# Patient Record
Sex: Male | Born: 1965 | Hispanic: No | Smoking: Current every day smoker
Health system: Southern US, Community
[De-identification: ages and names within clinical notes are randomized; demographics above are authoritative.]

## PROBLEM LIST (undated history)

## (undated) DIAGNOSIS — S72001A Fracture of unspecified part of neck of right femur, initial encounter for closed fracture: Secondary | ICD-10-CM

## (undated) DIAGNOSIS — K469 Unspecified abdominal hernia without obstruction or gangrene: Secondary | ICD-10-CM

## (undated) DIAGNOSIS — E119 Type 2 diabetes mellitus without complications: Secondary | ICD-10-CM

## (undated) DIAGNOSIS — E785 Hyperlipidemia, unspecified: Secondary | ICD-10-CM

## (undated) DIAGNOSIS — A809 Acute poliomyelitis, unspecified: Secondary | ICD-10-CM

## (undated) HISTORY — DX: Unspecified abdominal hernia without obstruction or gangrene: K46.9

## (undated) HISTORY — DX: Hyperlipidemia, unspecified: E78.5

## (undated) HISTORY — PX: HERNIA REPAIR: SHX51

## (undated) HISTORY — DX: Fracture of unspecified part of neck of right femur, initial encounter for closed fracture: S72.001A

## (undated) HISTORY — DX: Type 2 diabetes mellitus without complications: E11.9

---

## 2006-09-14 ENCOUNTER — Emergency Department (HOSPITAL_COMMUNITY): Admission: EM | Admit: 2006-09-14 | Discharge: 2006-09-14 | Payer: Self-pay | Admitting: Emergency Medicine

## 2006-10-10 ENCOUNTER — Emergency Department (HOSPITAL_COMMUNITY): Admission: EM | Admit: 2006-10-10 | Discharge: 2006-10-10 | Payer: Self-pay | Admitting: Family Medicine

## 2007-04-24 ENCOUNTER — Ambulatory Visit: Payer: Self-pay | Admitting: Nurse Practitioner

## 2007-05-30 ENCOUNTER — Ambulatory Visit (HOSPITAL_COMMUNITY): Admission: RE | Admit: 2007-05-30 | Discharge: 2007-05-30 | Payer: Self-pay | Admitting: Internal Medicine

## 2007-07-18 ENCOUNTER — Encounter: Admission: RE | Admit: 2007-07-18 | Discharge: 2007-08-10 | Payer: Self-pay | Admitting: Internal Medicine

## 2007-07-23 ENCOUNTER — Ambulatory Visit (HOSPITAL_BASED_OUTPATIENT_CLINIC_OR_DEPARTMENT_OTHER): Admission: RE | Admit: 2007-07-23 | Discharge: 2007-07-23 | Payer: Self-pay | Admitting: General Surgery

## 2007-09-04 DIAGNOSIS — K469 Unspecified abdominal hernia without obstruction or gangrene: Secondary | ICD-10-CM

## 2007-09-04 HISTORY — DX: Unspecified abdominal hernia without obstruction or gangrene: K46.9

## 2008-02-03 ENCOUNTER — Emergency Department (HOSPITAL_COMMUNITY): Admission: EM | Admit: 2008-02-03 | Discharge: 2008-02-03 | Payer: Self-pay | Admitting: Emergency Medicine

## 2009-11-13 ENCOUNTER — Encounter: Admission: RE | Admit: 2009-11-13 | Discharge: 2009-11-13 | Payer: Self-pay | Admitting: Specialist

## 2011-04-12 NOTE — Op Note (Signed)
NAMEHENDRICK, PAVICH                 ACCOUNT NO.:  [id number]   MEDICAL RECORD NO.:  [id number]          PATIENT TYPE:  AMB   LOCATION:  DSC                          FACILITY:  [facility]   PHYSICIAN:  Gabrielle Dare. Janee Morn, M.D.DATE OF BIRTH:  16-Jul-1966   DATE OF PROCEDURE:  07/23/2007  DATE OF DISCHARGE:                               OPERATIVE REPORT   PREOPERATIVE DIAGNOSIS:  Right inguinal hernia.   POSTOPERATIVE DIAGNOSIS:  Right inguinal hernia.   PROCEDURE:  Repair of right inguinal hernia with mesh.   SURGEON:  Gabrielle Dare. Janee Morn, M.D.   ANESTHESIA:  General with laryngeal mask airway.   HISTORY OF PRESENT ILLNESS:  Mr. Biskup is a 45 year old gentleman who I  evaluated in the office for a symptomatic right inguinal hernia.  He  presents today for elective repair.   PROCEDURE IN DETAIL:  Informed consent was obtained.  The patient  received intravenous antibiotics.  He was identified, and his site was  marked.  He was brought to the operating room.  General anesthesia with  laryngeal mask airway was administered by the anesthesia staff.  His  abdomen and groins were prepped and draped in sterile fashion.  0.25%  Marcaine with epinephrine was injected along the planned line of  incision in the right groin and out towards the anterior-superior iliac  spine for some nerve block and postoperative pain relief.  The right  inguinal incision was made.  The subcutaneous tissues were dissected  down through Scarpa's fascia, revealing the external oblique fascia.  This was somewhat attenuated out laterally.  It was divided at the very  lateral aspect, and the superior aspect of the external oblique was  dissected free off the underlying musculature and the inferior aspect  was dissected down, revealing the shelving edge of the inguinal  ligament.  The cord structures were encircled with a Penrose drain.  The  floor was then inspected, and he had a direct inguinal hernia present.  This  easily reduced.  The cord was inspected at the internal ring.  There was no indirect hernia sac present.  The cord structures were  protected.  The direct hernia was then held reduced with a single figure-  of-eight 2-0 Vicryl suture from the shelving edge of the inguinal  ligament to the transversalis.  This kept the direct hernia sac reduced.  The hernia repair was then completed with a polypropylene keyhole mesh.  This was fashioned to shape and then secured to the tissues over the  pubic tubercle medially with the 0 Prolene suture.  It was sewn in a  running fashion with 0 Prolene inferiorly along the shelving edge of the  inguinal ligament out laterally, and then it was tacked down in an  interrupted fashion with 0 Prolene again medially by the tissues over  the pubic tubercle and then extending out laterally in the superior  portion along the transversalis.  The two leaflets of the mesh were  rejoined behind the cord to recreate the ring.  They were tacked  together and to the underlying musculature with several interrupted 0  Prolene sutures.  The area was copiously irrigated.  Meticulous  hemostasis was ensured.  The aperture in the mesh admitted the tip of a  fifth digit along with the cord structures.  The cord structures  remained nonedematous and viable.  Some additional local anesthetic was  injected.  The external oblique fascia was then closed with a running 2-  0 Vicryl suture.  The subcutaneous tissues were irrigated.  Hemostasis  was again ensured.  Scarpa's fascia was closed with interrupted 3-0  Vicryl sutures, and the skin was closed with running 4-0 Monocryl  subcuticular stitch.  Sponge, needle and instrument counts were correct.  Benzoin, Steri-Strips, and sterile dressings were applied.   The patient tolerated the procedure well without apparent complication.  His right testicle was returned to anatomic position in the scrotum at  the end of the case, and he was  taken to the recovery room in stable  condition.      Gabrielle Dare Janee Morn, M.D.  Electronically Signed     BET/MEDQ  D:  07/23/2007  T:  07/24/2007  Job:  [id number]   cc:   Fleet Contras, M.D.

## 2011-09-09 LAB — POCT HEMOGLOBIN-HEMACUE
Hemoglobin: 18.3 — ABNORMAL HIGH
Operator id: 123881

## 2013-01-29 ENCOUNTER — Ambulatory Visit (INDEPENDENT_AMBULATORY_CARE_PROVIDER_SITE_OTHER): Payer: BC Managed Care – PPO | Admitting: Emergency Medicine

## 2013-01-29 VITALS — BP 133/76 | HR 71 | Temp 98.0°F | Resp 18 | Ht 66.0 in | Wt 137.0 lb

## 2013-01-29 DIAGNOSIS — E119 Type 2 diabetes mellitus without complications: Secondary | ICD-10-CM

## 2013-01-29 DIAGNOSIS — K649 Unspecified hemorrhoids: Secondary | ICD-10-CM

## 2013-01-29 MED ORDER — SIMVASTATIN 20 MG PO TABS: 20.0000 mg | ORAL_TABLET | Freq: Every evening | ORAL | Status: DC

## 2013-01-29 MED ORDER — METFORMIN HCL ER (MOD) 500 MG PO TB24: ORAL_TABLET | ORAL | Status: DC

## 2013-01-29 MED ORDER — LISINOPRIL 10 MG PO TABS: 10.0000 mg | ORAL_TABLET | Freq: Every day | ORAL | Status: DC

## 2013-01-29 MED ORDER — HYDROCORTISONE ACETATE 25 MG RE SUPP: 25.0000 mg | Freq: Two times a day (BID) | RECTAL | Status: DC

## 2013-01-29 NOTE — Progress Notes (Addendum)
[facility] [address] [place] [address] [phone]  Date:  01/29/2013   Name:  Alfie Rideaux   DOB:  May 01, 1966   MRN:  [id number]  PCP:  No primary provider on file.    Chief Complaint: Rectal Pain   History of Present Illness:  [place]field Dawood is a 47 y.o. very pleasant male patient who presents with the following:  History of NIDDM.  Ran out of medication a month ago and has not been taking anything.  Has not checked his sugar ever.  Needs a new doctor and wants to come here for routine care.  Denies other complaint or health concern today.   Patient Active Problem List  Diagnosis  . HERNIA    Past Medical History  Diagnosis Date  . Diabetes mellitus without complication     Past Surgical History  Procedure Laterality Date  . Hernia repair      History  Substance Use Topics  . Smoking status: Current Every Day Smoker  . Smokeless tobacco: Never Used  . Alcohol Use: Yes     Comment: 1 beer every day    No family history on file.  Not on File  Medication list has been reviewed and updated.  No current outpatient prescriptions on file prior to visit.   No current facility-administered medications on file prior to visit.    Review of Systems:  As per HPI, otherwise negative.    Physical Examination: Filed Vitals:   01/29/13 1855  BP: 133/76  Pulse: 71  Temp: 98 F (36.7 C)  Resp: 18   Filed Vitals:   01/29/13 1855  Height: 5\' 6"  (1.676 m)  Weight: 137 lb (62.143 kg)   Body mass index is 22.12 kg/(m^2). Ideal Body Weight: Weight in (lb) to have BMI = 25: 154.6  GEN: WDWN, NAD, Non-toxic, A & O x 3 HEENT: Atraumatic, Normocephalic. Neck supple. No masses, No LAD. Ears and Nose: No external deformity. CV: RRR, No M/G/R. No JVD. No thrill. No extra heart sounds. PULM: CTA B, no wheezes, crackles, rhonchi. No retractions. No resp. distress. No accessory muscle use. ABD: S, NT, ND, +BS. No rebound. No HSM. EXTR: No  c/c/e NEURO Normal gait.  PSYCH: Normally interactive. Conversant. Not depressed or anxious appearing.  Calm demeanor.    Assessment and Plan: NIDDM Add lisinopril Labs Hemorrhoids by history Follow up in 104  Carmelina Dane, MD  Results for orders placed in visit on 01/29/13  GLUCOSE, POCT (MANUAL RESULT ENTRY)      Result Value Range   POC Glucose 158 (*) 70 - 99 mg/dl  POCT GLYCOSYLATED HEMOGLOBIN (HGB A1C)      Result Value Range   Hemoglobin A1C 7.2

## 2014-05-21 ENCOUNTER — Ambulatory Visit (INDEPENDENT_AMBULATORY_CARE_PROVIDER_SITE_OTHER): Payer: BC Managed Care – PPO | Admitting: Family Medicine

## 2014-05-21 VITALS — BP 124/72 | HR 88 | Temp 97.7°F | Resp 16 | Ht 67.0 in | Wt 131.6 lb

## 2014-05-21 DIAGNOSIS — M436 Torticollis: Secondary | ICD-10-CM

## 2014-05-21 MED ORDER — CYCLOBENZAPRINE HCL 10 MG PO TABS: 10.0000 mg | ORAL_TABLET | Freq: Every day | ORAL | Status: DC

## 2014-05-21 MED ORDER — PREDNISONE 20 MG PO TABS: 40.0000 mg | ORAL_TABLET | Freq: Every day | ORAL | Status: DC

## 2014-05-21 NOTE — Progress Notes (Signed)
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

## 2014-05-21 NOTE — Patient Instructions (Signed)

## 2015-04-27 DIAGNOSIS — B91 Sequelae of poliomyelitis: Secondary | ICD-10-CM | POA: Insufficient documentation

## 2015-04-27 DIAGNOSIS — M6281 Muscle weakness (generalized): Secondary | ICD-10-CM

## 2015-11-30 ENCOUNTER — Encounter: Payer: Self-pay | Admitting: Urgent Care

## 2015-11-30 ENCOUNTER — Ambulatory Visit (INDEPENDENT_AMBULATORY_CARE_PROVIDER_SITE_OTHER): Payer: BLUE CROSS/BLUE SHIELD | Admitting: Urgent Care

## 2015-11-30 VITALS — BP 120/80 | HR 73 | Temp 98.0°F | Resp 16 | Ht 67.0 in | Wt 130.2 lb

## 2015-11-30 DIAGNOSIS — E119 Type 2 diabetes mellitus without complications: Secondary | ICD-10-CM | POA: Diagnosis not present

## 2015-11-30 DIAGNOSIS — R109 Unspecified abdominal pain: Secondary | ICD-10-CM | POA: Diagnosis not present

## 2015-11-30 DIAGNOSIS — F17200 Nicotine dependence, unspecified, uncomplicated: Secondary | ICD-10-CM | POA: Diagnosis not present

## 2015-11-30 DIAGNOSIS — E785 Hyperlipidemia, unspecified: Secondary | ICD-10-CM | POA: Diagnosis not present

## 2015-11-30 DIAGNOSIS — R10A0 Flank pain, unspecified side: Secondary | ICD-10-CM

## 2015-11-30 DIAGNOSIS — K648 Other hemorrhoids: Secondary | ICD-10-CM | POA: Diagnosis not present

## 2015-11-30 DIAGNOSIS — K644 Residual hemorrhoidal skin tags: Secondary | ICD-10-CM

## 2015-11-30 LAB — LIPID PANEL
CHOL/HDL RATIO: 2.8 ratio (ref <=5.0)
Cholesterol: 146 mg/dL (ref 125–200)
HDL: 52 mg/dL (ref >=40)
LDL Cholesterol: 52 mg/dL (ref <130)
Triglycerides: 211 mg/dL — ABNORMAL HIGH (ref <150)
VLDL: 42 mg/dL — AB (ref <30)

## 2015-11-30 LAB — COMPLETE METABOLIC PANEL WITH GFR
ALBUMIN: 4.5 g/dL (ref 3.6–5.1)
ALK PHOS: 99 U/L (ref 40–115)
ALT: 47 U/L — AB (ref 9–46)
AST: 24 U/L (ref 10–40)
BUN: 11 mg/dL (ref 7–25)
CO2: 28 mmol/L (ref 20–31)
Calcium: 9.9 mg/dL (ref 8.6–10.3)
Chloride: 102 mmol/L (ref 98–110)
Creat: 0.66 mg/dL (ref 0.60–1.35)
GFR, Est African American: >89 mL/min (ref >=60)
GFR, Est Non African American: >89 mL/min (ref >=60)
GLUCOSE: 142 mg/dL — AB (ref 65–99)
POTASSIUM: 4.5 mmol/L (ref 3.5–5.3)
SODIUM: 139 mmol/L (ref 135–146)
Total Bilirubin: 0.6 mg/dL (ref 0.2–1.2)
Total Protein: 6.9 g/dL (ref 6.1–8.1)

## 2015-11-30 LAB — POCT GLYCOSYLATED HEMOGLOBIN (HGB A1C): Hemoglobin A1C: 6.7

## 2015-11-30 MED ORDER — ATORVASTATIN CALCIUM 40 MG PO TABS: 40.0000 mg | ORAL_TABLET | Freq: Every day | ORAL | Status: DC

## 2015-11-30 MED ORDER — METFORMIN HCL ER (MOD) 500 MG PO TB24: 1000.0000 mg | ORAL_TABLET | Freq: Two times a day (BID) | ORAL | Status: DC

## 2015-11-30 NOTE — Progress Notes (Signed)
  Medical screening examination/treatment/procedure(s) were performed by non-physician practitioner and as supervising physician I was immediately available for consultation/collaboration.     

## 2015-11-30 NOTE — Patient Instructions (Signed)
Diabetes Mellitus and Food It is important for you to manage your blood sugar (glucose) level. Your blood glucose level can be greatly affected by what you eat. Eating healthier foods in the appropriate amounts throughout the day at about the same time each day will help you control your blood glucose level. It can also help slow or prevent worsening of your diabetes mellitus. Healthy eating may even help you improve the level of your blood pressure and reach or maintain a healthy weight.  General recommendations for healthful eating and cooking habits include:  Eating meals and snacks regularly. Avoid going long periods of time without eating to lose weight.  Eating a diet that consists mainly of plant-based foods, such as fruits, vegetables, nuts, legumes, and whole grains.  Using low-heat cooking methods, such as baking, instead of high-heat cooking methods, such as deep frying. Work with your dietitian to make sure you understand how to use the Nutrition Facts information on food labels. HOW CAN FOOD AFFECT ME? Carbohydrates Carbohydrates affect your blood glucose level more than any other type of food. Your dietitian will help you determine how many carbohydrates to eat at each meal and teach you how to count carbohydrates. Counting carbohydrates is important to keep your blood glucose at a healthy level, especially if you are using insulin or taking certain medicines for diabetes mellitus. Alcohol Alcohol can cause sudden decreases in blood glucose (hypoglycemia), especially if you use insulin or take certain medicines for diabetes mellitus. Hypoglycemia can be a life-threatening condition. Symptoms of hypoglycemia (sleepiness, dizziness, and disorientation) are similar to symptoms of having too much alcohol.  If your health care provider has given you approval to drink alcohol, do so in moderation and use the following guidelines:  Women should not have more than one drink per day, and men  should not have more than two drinks per day. One drink is equal to:  12 oz of beer.  5 oz of wine.  1 oz of hard liquor.  Do not drink on an empty stomach.  Keep yourself hydrated. Have water, diet soda, or unsweetened iced tea.  Regular soda, juice, and other mixers might contain a lot of carbohydrates and should be counted. WHAT FOODS ARE NOT RECOMMENDED? As you make food choices, it is important to remember that all foods are not the same. Some foods have fewer nutrients per serving than other foods, even though they might have the same number of calories or carbohydrates. It is difficult to get your body what it needs when you eat foods with fewer nutrients. Examples of foods that you should avoid that are high in calories and carbohydrates but low in nutrients include:  Trans fats (most processed foods list trans fats on the Nutrition Facts label).  Regular soda.  Juice.  Candy.  Sweets, such as cake, pie, doughnuts, and cookies.  Fried foods. WHAT FOODS CAN I EAT? Eat nutrient-rich foods, which will nourish your body and keep you healthy. The food you should eat also will depend on several factors, including:  The calories you need.  The medicines you take.  Your weight.  Your blood glucose level.  Your blood pressure level.  Your cholesterol level. You should eat a variety of foods, including:  Protein.  Lean cuts of meat.  Proteins low in saturated fats, such as fish, egg whites, and beans. Avoid processed meats.  Fruits and vegetables.  Fruits and vegetables that may help control blood glucose levels, such as apples, mangoes, and   yams.  Dairy products.  Choose fat-free or low-fat dairy products, such as milk, yogurt, and cheese.  Grains, bread, pasta, and rice.  Choose whole grain products, such as multigrain bread, whole oats, and brown rice. These foods may help control blood pressure.  Fats.  Foods containing healthful fats, such as nuts,  avocado, olive oil, canola oil, and fish. DOES EVERYONE WITH DIABETES MELLITUS HAVE THE SAME MEAL PLAN? Because every person with diabetes mellitus is different, there is not one meal plan that works for everyone. It is very important that you meet with a dietitian who will help you create a meal plan that is just right for you.   This information is not intended to replace advice given to you by your health care provider. Make sure you discuss any questions you have with your health care provider.   Document Released: 08/11/2005 Document Revised: 12/05/2014 Document Reviewed: 10/11/2013 Elsevier Interactive Patient Education 2016 [employer]    Smoking Hazards Smoking cigarettes is extremely bad for your health. Tobacco smoke has over 200 known poisons in it. It contains the poisonous gases nitrogen oxide and carbon monoxide. There are over 60 chemicals in tobacco smoke that cause cancer. Some of the chemicals found in cigarette smoke include:   Cyanide.   Benzene.   Formaldehyde.   Methanol (wood alcohol).   Acetylene (fuel used in welding torches).   Ammonia.  Even smoking lightly shortens your life expectancy by several years. You can greatly reduce the risk of medical problems for you and your family by stopping now. Smoking is the most preventable cause of death and disease in our society. Within days of quitting smoking, your circulation improves, you decrease the risk of having a heart attack, and your lung capacity improves. There may be some increased phlegm in the first few days after quitting, and it may take months for your lungs to clear up completely. Quitting for 10 years reduces your risk of developing lung cancer to almost that of a nonsmoker.  WHAT ARE THE RISKS OF SMOKING? Cigarette smokers have an increased risk of many serious medical problems, including:  Lung cancer.   Lung disease (such as pneumonia, bronchitis, and emphysema).   Heart attack and  chest pain due to the heart not getting enough oxygen (angina).   Heart disease and peripheral blood vessel disease.   Hypertension.   Stroke.   Oral cancer (cancer of the lip, mouth, or voice box).   Bladder cancer.   Pancreatic cancer.   Cervical cancer.   Pregnancy complications, including premature birth.   Stillbirths and smaller newborn babies, birth defects, and genetic damage to sperm.   Early menopause.   Lower estrogen level for women.   Infertility.   Facial wrinkles.   Blindness.   Increased risk of broken bones (fractures).   Senile dementia.   Stomach ulcers and internal bleeding.   Delayed wound healing and increased risk of complications during surgery. Because of secondhand smoke exposure, children of smokers have an increased risk of the following:   Sudden infant death syndrome (SIDS).   Respiratory infections.   Lung cancer.   Heart disease.   Ear infections.  WHY IS SMOKING ADDICTIVE? Nicotine is the chemical agent in tobacco that is capable of causing addiction or dependence. When you smoke and inhale, nicotine is absorbed rapidly into the bloodstream through your lungs. Both inhaled and noninhaled nicotine may be addictive.  WHAT ARE THE BENEFITS OF QUITTING?  There are many health benefits  to quitting smoking. Some are:   The likelihood of developing cancer and heart disease decreases. Health improvements are seen almost immediately.   Blood pressure, pulse rate, and breathing patterns start returning to normal soon after quitting.   People who quit may see an improvement in their overall quality of life.  HOW DO YOU QUIT SMOKING? Smoking is an addiction with both physical and psychological effects, and longtime habits can be hard to change. Your health care provider can recommend:  Programs and community resources, which may include group support, education, or therapy.  Replacement products, such as  patches, gum, and nasal sprays. Use these products only as directed. Do not replace cigarette smoking with electronic cigarettes (commonly called e-cigarettes). The safety of e-cigarettes is unknown, and some may contain harmful chemicals. FOR MORE INFORMATION  American Lung Association: www.lung.org  American Cancer Society: www.cancer.org   This information is not intended to replace advice given to you by your health care provider. Make sure you discuss any questions you have with your health care provider.   Document Released: 12/22/2004 Document Revised: 09/04/2013 Document Reviewed: 05/06/2013 Elsevier Interactive Patient Education [employer]

## 2015-11-30 NOTE — Progress Notes (Signed)
    MRN: [id number] DOB: 10/20/1966  Subjective:   Kenneth Roth is a 50 y.o. male presenting for chief complaint of Diabetes  Diabetes - managed with Metformin 1,000mg  BID. Does not check blood sugar at home. Reports polydipsia, polyuria. Has not seen an ophthalmologist in ~2 years. Drinks liquor every day. Denies chest pain, shortness of breath, nausea, vomiting, abdominal pain, hematuria, cuts our wounds in his feet that won't heal. Limits carbs, does not exercise. Works as a Conservation officer, naturecashier. Smokes 1.5 ppd. Not interested in quitting. States that he understands the risks of this.  HL - managed atorvastatin. Admits good compliance with this medication. Diet and exercise as above.  Hemorrhoids - reports longstanding history of hemorrhoids. Takes Anusol for this. Would like referral to general surgery for consult.  Kenneth Roth has a current medication list which includes the following prescription(s): atorvastatin, calcium-vitamin d, and metformin. Also has No Known Allergies.  Kenneth Roth  has a past medical history of Diabetes mellitus without complication (HCC). Also  has past surgical history that includes Hernia repair.  Objective:   Vitals: BP 120/80 mmHg  Pulse 73  Temp(Src) 98 F (36.7 C) (Oral)  Resp 16  Ht 5\' 7"  (1.702 m)  Wt 130 lb 3.2 oz (59.058 kg)  BMI 20.39 kg/m2  SpO2 97%  Physical Exam  Constitutional: He is oriented to person, place, and time. He appears well-developed and well-nourished.  HENT:  Dentition with significant plaque and dental carries.  Eyes: Right eye exhibits no discharge. Left eye exhibits no discharge. No scleral icterus.  Neck: Normal range of motion. Neck supple. No thyromegaly present.  Cardiovascular: Normal rate, regular rhythm and intact distal pulses.  Exam reveals no gallop and no friction rub.   No murmur heard. Pulmonary/Chest: No respiratory distress. He has no wheezes. He has no rales.  Abdominal: Soft. Bowel sounds are normal. He exhibits no distension  and no mass. There is no tenderness.  Neurological: He is alert and oriented to person, place, and time.  Skin: Skin is warm and dry. No rash noted. No erythema. No pallor.   Results for orders placed or performed in visit on 11/30/15 (from the past 24 hour(s))  POCT glycosylated hemoglobin (Hb A1C)     Status: None   Collection Time: 11/30/15  1:43 PM  Result Value Ref Range   Hemoglobin A1C 6.7    Assessment and Plan :   1. Controlled type 2 diabetes mellitus without complication, without long-term current use of insulin (HCC) - Controlled, continue current regimen. Patient declined additional medications. Recommended dietary modifications. Referral for diabetic eye exam. - Recheck in 6 months.   2. Hyperlipidemia - Refilled atorvastatin, recheck in 6 months - Lipid panel  3. External hemorrhoids - Ambulatory referral to General Surgery - Continue using Annusol  4. Tobacco use disorder - Counseled on risks of smoking. Recommended smoking cessation, the patient declined.  5. Flank pain - Patient reported flank pain at the end of his visit, this has been ongoing and worsening for the past 2 years. He declined further work-up and stated he would return for labs and imaging as needed.  Wallis BambergMario Julius Matus, PA-C [facility] [phone] 11/30/2015 1:23 PM

## 2015-12-02 ENCOUNTER — Telehealth: Payer: Self-pay

## 2015-12-02 MED ORDER — METFORMIN HCL ER 500 MG PO TB24: 1000.0000 mg | ORAL_TABLET | Freq: Two times a day (BID) | ORAL | Status: DC

## 2015-12-02 NOTE — Telephone Encounter (Signed)
Fax from pharm that metformin ER 500 needed a PA. Called pharm who stated that it is because it was ordered as the MOD (modified) tablet. According to Mani's OV notes, he intended pt to stay on same medication which according to pharm was the regular generic metformin ER 500, 2 tablets BID. I authorized change to this and will change in EPIC.

## 2016-03-08 DIAGNOSIS — Z0271 Encounter for disability determination: Secondary | ICD-10-CM

## 2016-06-27 ENCOUNTER — Ambulatory Visit (INDEPENDENT_AMBULATORY_CARE_PROVIDER_SITE_OTHER): Payer: BLUE CROSS/BLUE SHIELD

## 2016-06-27 ENCOUNTER — Ambulatory Visit (INDEPENDENT_AMBULATORY_CARE_PROVIDER_SITE_OTHER): Payer: BLUE CROSS/BLUE SHIELD | Admitting: Family Medicine

## 2016-06-27 VITALS — BP 120/72 | HR 82 | Temp 98.1°F | Resp 16 | Ht 67.0 in | Wt 122.4 lb

## 2016-06-27 DIAGNOSIS — G8929 Other chronic pain: Secondary | ICD-10-CM

## 2016-06-27 DIAGNOSIS — R1012 Left upper quadrant pain: Secondary | ICD-10-CM | POA: Diagnosis not present

## 2016-06-27 DIAGNOSIS — E785 Hyperlipidemia, unspecified: Secondary | ICD-10-CM

## 2016-06-27 DIAGNOSIS — R109 Unspecified abdominal pain: Secondary | ICD-10-CM

## 2016-06-27 DIAGNOSIS — R634 Abnormal weight loss: Secondary | ICD-10-CM | POA: Diagnosis not present

## 2016-06-27 DIAGNOSIS — K219 Gastro-esophageal reflux disease without esophagitis: Secondary | ICD-10-CM

## 2016-06-27 DIAGNOSIS — E119 Type 2 diabetes mellitus without complications: Secondary | ICD-10-CM | POA: Diagnosis not present

## 2016-06-27 LAB — POCT URINALYSIS DIP (MANUAL ENTRY)
BILIRUBIN UA: NEGATIVE
Bilirubin, UA: NEGATIVE
GLUCOSE UA: NEGATIVE
Leukocytes, UA: NEGATIVE
Nitrite, UA: NEGATIVE
Protein Ur, POC: NEGATIVE
RBC UA: NEGATIVE
UROBILINOGEN UA: 0.2
pH, UA: 5.5

## 2016-06-27 LAB — LIPID PANEL
CHOL/HDL RATIO: 1.9 ratio (ref <=5.0)
CHOLESTEROL: 124 mg/dL — AB (ref 125–200)
HDL: 64 mg/dL (ref >=40)
LDL Cholesterol: 34 mg/dL (ref <130)
TRIGLYCERIDES: 132 mg/dL (ref <150)
VLDL: 26 mg/dL (ref <30)

## 2016-06-27 LAB — COMPREHENSIVE METABOLIC PANEL
ALBUMIN: 4.5 g/dL (ref 3.6–5.1)
ALT: 42 U/L (ref 9–46)
AST: 29 U/L (ref 10–40)
Alkaline Phosphatase: 85 U/L (ref 40–115)
BILIRUBIN TOTAL: 0.7 mg/dL (ref 0.2–1.2)
BUN: 9 mg/dL (ref 7–25)
CHLORIDE: 104 mmol/L (ref 98–110)
CO2: 26 mmol/L (ref 20–31)
CREATININE: 0.68 mg/dL (ref 0.60–1.35)
Calcium: 9.6 mg/dL (ref 8.6–10.3)
Glucose, Bld: 128 mg/dL — ABNORMAL HIGH (ref 65–99)
Potassium: 4.5 mmol/L (ref 3.5–5.3)
SODIUM: 138 mmol/L (ref 135–146)
TOTAL PROTEIN: 6.6 g/dL (ref 6.1–8.1)

## 2016-06-27 LAB — POCT CBC
Granulocyte percent: 67.2 %G (ref 37–80)
HCT, POC: 45.2 % (ref 43.5–53.7)
HEMOGLOBIN: 16.2 g/dL (ref 14.1–18.1)
LYMPH, POC: 2 (ref 0.6–3.4)
MCH, POC: 32.7 pg — AB (ref 27–31.2)
MCHC: 35.9 g/dL — AB (ref 31.8–35.4)
MCV: 91 fL (ref 80–97)
MID (CBC): 0.5 (ref 0–0.9)
MPV: 7.8 fL (ref 0–99.8)
PLATELET COUNT, POC: 180 10*3/uL (ref 142–424)
POC Granulocyte: 5.2 (ref 2–6.9)
POC LYMPH PERCENT: 26.5 %L (ref 10–50)
POC MID %: 6.3 % (ref 0–12)
RBC: 4.96 M/uL (ref 4.69–6.13)
RDW, POC: 13.1 %
WBC: 7.7 10*3/uL (ref 4.6–10.2)

## 2016-06-27 LAB — POC MICROSCOPIC URINALYSIS (UMFC): MUCUS RE: ABSENT

## 2016-06-27 LAB — POCT GLYCOSYLATED HEMOGLOBIN (HGB A1C): HEMOGLOBIN A1C: 6.4

## 2016-06-27 LAB — GLUCOSE, POCT (MANUAL RESULT ENTRY): POC Glucose: 133 mg/dl — AB (ref 70–99)

## 2016-06-27 NOTE — Progress Notes (Signed)
Patient ID: Kenneth Roth, male    DOB: 10-Dec-1965  Age: 50 y.o. MRN: [id number]  Chief Complaint  Patient presents with  . Other    "left side pain " x 8 months  . Other    weight loss    Subjective:   50 year old man who comes in with history of left flank pain that is been going on for over 8 months. He has lost about 8 pounds. The most he ever weighed was 142, but he was 1:30 at the turn of the year and now is down to 122. He eats okay his appetite has not changed much. He is separated and lives alone. He does drink 2 shots every day except for Sundays he does not drink. He smokes. He is not employed. 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 He is diabetic but does not check his sugars. He does take his medicines regularly. He has not had any chest pain or major cough. No shortness of breath. No nausea or vomiting. Bowels active well. He does have some hemorrhoid problems. No genitourinary complaints.  As he was leaving the patient told me that he has applied for disability. 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 He got turned down from disability and is going to try again.  Current allergies, medications, problem list, past/family and social histories reviewed.  Objective:  BP 120/72 (BP Location: Left Arm, Patient Position: Sitting, Cuff Size: Normal)   Pulse 82   Temp 98.1 F (36.7 C) (Oral)   Resp 16   Ht 5\' 7"  (1.702 m)   Wt 122 lb 6.4 oz (55.5 kg)   SpO2 98%   BMI 19.17 kg/m   No major distress. Walks with her severe limp from his old polio an atrophic right leg. Chest clear. Heart regular without murmurs. Abdomen soft. Bowel sounds normal. Has a very pulsatile aortic bifurcation below the umbilicus, but he is pretty slender. No CVA tenderness.  Assessment & Plan:    Assessment: 1. Loss of weight   2. Type 2 diabetes mellitus without complication, without long-term current use of insulin (HCC)   3. Gastroesophageal reflux disease without esophagitis   4. Chronic left flank pain   5. Hyperlipidemia       Plan: See orders. May need a CT scan. Need to check out his diabetes as well as checking the weight loss and flank pain.  Orders Placed This Encounter  Procedures  . DG Abd Acute W/Chest    Standing Status:   Future    Number of Occurrences:   1    Standing Expiration Date:   06/27/2017    Order Specific Question:   Reason for exam:    Answer:   abdominal pain,  flank pain, weight loss, smoker    Order Specific Question:   Preferred imaging location?    Answer:   External  . CT Abdomen Pelvis Wo Contrast    Standing Status:   Future    Standing Expiration Date:   09/27/2017    Order Specific Question:   Reason for Exam (SYMPTOM  OR DIAGNOSIS REQUIRED)    Answer:   weight loss, left flank pain; is diabetic on metformin    Order Specific Question:   Preferred imaging location?  Answer:   External    Order Specific Question:   Call Results- Best Contact Number?    Answer:   Patient request schedule it for next Monday 07/04/16  . Comprehensive metabolic panel  . Lipid panel  . POCT CBC  . POCT glucose (manual entry)  . POCT glycosylated hemoglobin (Hb A1C)  . POCT urinalysis dipstick  . POCT Microscopic Urinalysis (UMFC)    No orders of the defined types were placed in this encounter.   Results for orders placed or performed in visit on 06/27/16  POCT CBC  Result Value Ref Range   WBC 7.7 4.6 - 10.2 K/uL   Lymph, poc 2.0 0.6 - 3.4   POC LYMPH PERCENT 26.5 10 - 50 %L   MID (cbc) 0.5 0 - 0.9   POC MID % 6.3 0 - 12 %M   POC Granulocyte 5.2 2 - 6.9   Granulocyte percent 67.2 37 - 80 %G   RBC 4.96 4.69 - 6.13 M/uL   Hemoglobin 16.2 14.1 - 18.1 g/dL   HCT, POC 06.2 69.4 - 53.7 %   MCV 91.0 80 - 97 fL   MCH, POC 32.7 (A) 27 - 31.2  pg   MCHC 35.9 (A) 31.8 - 35.4 g/dL   RDW, POC 85.4 %   Platelet Count, POC 180 142 - 424 K/uL   MPV 7.8 0 - 99.8 fL  POCT glucose (manual entry)  Result Value Ref Range   POC Glucose 133 (A) 70 - 99 mg/dl  POCT glycosylated hemoglobin (Hb A1C)  Result Value Ref Range   Hemoglobin A1C 6.4   POCT urinalysis dipstick  Result Value Ref Range   Color, UA yellow yellow   Clarity, UA clear clear   Glucose, UA negative negative   Bilirubin, UA negative negative   Ketones, POC UA negative negative   Spec Grav, UA <=1.005    Blood, UA negative negative   pH, UA 5.5    Protein Ur, POC negative negative   Urobilinogen, UA 0.2    Nitrite, UA Negative Negative   Leukocytes, UA Negative Negative  POCT Microscopic Urinalysis (UMFC)  Result Value Ref Range   WBC,UR,HPF,POC None None WBC/hpf   RBC,UR,HPF,POC None None RBC/hpf   Bacteria None None, Too numerous to count   Mucus Absent Absent   Epithelial Cells, UR Per Microscopy None None, Too numerous to count cells/hpf    DG ABDOMEN ACUTE W/ 1V CHEST COMPARISON:  One-view chest 11/13/2009 FINDINGS: The lungs are clear.  The heart size is normal. The bowel gas pattern is normal. No obstruction or free air is present. No radiopaque stone is present. A transitional L1 vertebral body is present. A transitional S1 vertebral body is present. There is incomplete fusion of posterior elements at S1. Focal rightward curvature is present at L5-S1. SI degenerative changes are noted on the left. IMPRESSION: 1. No acute cardiopulmonary disease. 2. Negative abdominal radiographs. 3. Focal rightward curvature at L5-S1 associated with a transitional S1 segment and pelvic tilt.    Patient Instructions   I will let you know the remainder of the lab results in a few days when they come back  We will schedule you for a CT scan for Monday, August 7. If you do not hear from the referral's desk here at this office by Thursday please call back and try  to confirm that it did get scheduled for you and we're you are to go.  If we do not find anything that  we need to do anything differently with at this time, I recommend that you return in about 4-6 weeks for a follow-up visit regarding the weight loss.  In the meanwhile try to be eating as much as you can to see if you can stabilize her weight or gain a little bit.  The diabetes is in good control at this time.  I would like to discourage excessive alcohol intake.  Also would like to recommend that you set a target for quitting smoking.    IF you received an x-ray today, you will receive an invoice from [facility]. Please contact [facility] at [phone] with questions or concerns regarding your invoice.   IF you received labwork today, you will receive an invoice from [employer]. Please contact [facility] at [phone] with questions or concerns regarding your invoice.   Our billing staff will not be able to assist you with questions regarding bills from these companies.  You will be contacted with the lab results as soon as they are available. The fastest way to get your results is to activate your My Chart account. Instructions are located on the last page of this paperwork. If you have not heard from [place] regarding the results in 2 weeks, please contact this office.        Return in about 5 weeks (around 08/01/2016).   Shasha Buchbinder, MD 06/27/2016

## 2016-06-27 NOTE — Patient Instructions (Addendum)
I will let you know the remainder of the lab results in a few days when they come back  We will schedule you for a CT scan for Monday, August 7. If you do not hear from the referral's desk here at this office by Thursday please call back and try to confirm that it did get scheduled for you and we're you are to go.  If we do not find anything that we need to do anything differently with at this time, I recommend that you return in about 4-6 weeks for a follow-up visit regarding the weight loss.  In the meanwhile try to be eating as much as you can to see if you can stabilize her weight or gain a little bit.  The diabetes is in good control at this time.  I would like to discourage excessive alcohol intake.  Also would like to recommend that you set a target for quitting smoking.    IF you received an x-ray today, you will receive an invoice from [facility]. Please contact [facility] at [phone] with questions or concerns regarding your invoice.   IF you received labwork today, you will receive an invoice from [employer]. Please contact [facility] at [phone] with questions or concerns regarding your invoice.   Our billing staff will not be able to assist you with questions regarding bills from these companies.  You will be contacted with the lab results as soon as they are available. The fastest way to get your results is to activate your My Chart account. Instructions are located on the last page of this paperwork. If you have not heard from [place] regarding the results in 2 weeks, please contact this office.

## 2016-06-28 ENCOUNTER — Telehealth: Payer: Self-pay

## 2016-06-28 NOTE — Telephone Encounter (Signed)
Dr Alwyn Ren ordered a CT Abdomen Pelvis W/O Contrast for the patient.  I attempted to complete the pre-authorization, but I need information on what the provider is looking to rule out.  I do not see anything in the notes regarding this.  Please advise, so I can complete the insurance authorization.  Thank you.

## 2016-06-29 ENCOUNTER — Encounter: Payer: Self-pay | Admitting: *Deleted

## 2016-06-29 NOTE — Telephone Encounter (Signed)
Dr Alwyn Ren can you please address

## 2016-06-30 NOTE — Telephone Encounter (Signed)
Response for scheduling the CT:  Looking for left flank malignancy re: weight loss, pain

## 2016-07-05 ENCOUNTER — Telehealth: Payer: Self-pay

## 2016-07-05 DIAGNOSIS — R634 Abnormal weight loss: Secondary | ICD-10-CM

## 2016-07-05 DIAGNOSIS — G8929 Other chronic pain: Secondary | ICD-10-CM

## 2016-07-05 DIAGNOSIS — R109 Unspecified abdominal pain: Secondary | ICD-10-CM

## 2016-07-05 NOTE — Telephone Encounter (Signed)
The order for CT Abdomen Pelvis needs to be changed per radiologist protocol before it can be scheduled.  It needs to be changed to CT Abdomen Pelvis With Contrast.  It is currently Without Contrast.  Thank you.  CB# for [facility]: [phone]

## 2016-07-05 NOTE — Telephone Encounter (Signed)
Done

## 2016-07-25 ENCOUNTER — Inpatient Hospital Stay: Admission: RE | Admit: 2016-07-25 | Payer: Self-pay | Source: Ambulatory Visit

## 2016-08-18 DIAGNOSIS — Z0271 Encounter for disability determination: Secondary | ICD-10-CM

## 2016-08-22 ENCOUNTER — Ambulatory Visit (INDEPENDENT_AMBULATORY_CARE_PROVIDER_SITE_OTHER): Payer: BLUE CROSS/BLUE SHIELD | Admitting: Family Medicine

## 2016-08-22 VITALS — BP 122/72 | HR 88 | Temp 98.3°F | Resp 17 | Ht 67.0 in | Wt 119.0 lb

## 2016-08-22 DIAGNOSIS — E119 Type 2 diabetes mellitus without complications: Secondary | ICD-10-CM

## 2016-08-22 DIAGNOSIS — E785 Hyperlipidemia, unspecified: Secondary | ICD-10-CM | POA: Insufficient documentation

## 2016-08-22 DIAGNOSIS — J309 Allergic rhinitis, unspecified: Secondary | ICD-10-CM | POA: Diagnosis not present

## 2016-08-22 DIAGNOSIS — Z8612 Personal history of poliomyelitis: Secondary | ICD-10-CM | POA: Insufficient documentation

## 2016-08-22 DIAGNOSIS — H9202 Otalgia, left ear: Secondary | ICD-10-CM | POA: Diagnosis not present

## 2016-08-22 MED ORDER — ATORVASTATIN CALCIUM 40 MG PO TABS: 20.0000 mg | ORAL_TABLET | Freq: Every day | ORAL | 3 refills | Status: DC

## 2016-08-22 MED ORDER — METFORMIN HCL ER 500 MG PO TB24: ORAL_TABLET | ORAL | 3 refills | Status: DC

## 2016-08-22 NOTE — Progress Notes (Signed)
Patient ID: Kenneth Roth, male    DOB: [birth year]  Age: 50 y.o. MRN: [id number]  Chief Complaint  Patient presents with  . Ear Pain    Left onset 1 week  . Medication Refill    metformin and atorvastatin    Subjective:   Patient is here for a follow-up check on his metformin a torque a statin. Labs were done about 7 weeks ago on these, and his cholesterol level was quite low. His A1c was good. He has lost a couple more pounds. He says he doesn't eat his aggressively as he used to. He is not employed at this time, is fairly sedentary.  Over the past week he has been having pain in his left ear. It bothers him a couple of days then stopped bothering him. It's a pulsatile discomfort. It is not hurting him today. His nose does have some drainage at times and he has to use a tissue a lot.  Current allergies, medications, problem list, past/family and social histories reviewed.  Objective:  BP 122/72 (BP Location: Right Arm, Patient Position: Sitting, Cuff Size: Normal)   Pulse 88   Temp 98.3 F (36.8 C) (Oral)   Resp 17   Ht 5\' 7"  (1.702 m)   Wt 119 lb (54 kg)   SpO2 98%   BMI 18.64 kg/m   The right TM is occluded with cerumen. The left TM is entirely normal. There is a tiny bit of wax in the top of the canal but nothing of concern. His throat is clear. Stained teeth. Neck supple without significant nodes. Chest clear. Heart regular without murmur. Abdomen soft without mass or tenderness. Pulsatile aorta still palpable, not felt to be enlarged.  Old polio with leg weakness.  Assessment & Plan:   Assessment: 1. Otalgia of left ear   2. Allergic rhinitis, unspecified allergic rhinitis type   3. Type 2 diabetes mellitus without complication, without long-term current use of insulin (HCC)   4. Hyperlipidemia   5. History of poliomyelitis       Plan: Try using some Afrin to open the eustachian tube if the ear discomfort recurs. If it keeps being a problem we will send him to an ENT  doctor. It could be allergies.  Cut back on his atorvastatin dose, leave other things to same, and return in about 3 months for lab checks.  No orders of the defined types were placed in this encounter.   Meds ordered this encounter  Medications  . atorvastatin (LIPITOR) 40 MG tablet    Sig: Take 0.5 tablets (20 mg total) by mouth daily.    Dispense:  90 tablet    Refill:  3  . metFORMIN (GLUCOPHAGE-XR) 500 MG 24 hr tablet    Sig: Take 2 in the morning and 1 in the evening for diabetes    Dispense:  270 tablet    Refill:  3    Pharmacy, please note that I sent this in for a different dose and you can void that prescription and use this one. Thank you         Patient Instructions  Decrease the atorvastatin to 20 mg (one half of a 40 mg pill) daily. The next time you get it filled, I have sent him an order for the 20 mg pills.Then you will just take one 20 mg pill a day.  Decrease the metformin to 2 in the morning and 1. in the evening. My prescription was sent in twice. I made  an error on the first one so make sure the pharmacist gives you the one that is for 2 in the morning and 1 in the evening  If you're ear begins hurting again, try using Afrin nose spray 2 sprays each nostril 2 times daily for 3 days.  If you nose keeps on tripping a lot, you can take over-the-counter loratadine or fexofenadine (Claritin or Allegra) 1 daily  Plan to return in about 3 months for recheck labs    Return in about 3 months (around 11/21/2016).   Natalina Wieting, MD 08/22/2016

## 2016-08-22 NOTE — Patient Instructions (Addendum)
Decrease the atorvastatin to 20 mg (one half of a 40 mg pill) daily. The next time you get it filled, I have sent him an order for the 20 mg pills.Then you will just take one 20 mg pill a day.  Decrease the metformin to 2 in the morning and 1. in the evening. My prescription was sent in twice. I made an error on the first one so make sure the pharmacist gives you the one that is for 2 in the morning and 1 in the evening  If you're ear begins hurting again, try using Afrin nose spray 2 sprays each nostril 2 times daily for 3 days.  If you nose keeps on tripping a lot, you can take over-the-counter loratadine or fexofenadine (Claritin or Allegra) 1 daily  Plan to return in about 3 months for recheck labs

## 2016-08-26 ENCOUNTER — Other Ambulatory Visit: Payer: Self-pay | Admitting: Urgent Care

## 2016-08-29 NOTE — Telephone Encounter (Signed)
Last ov 07/2016 Last labs 05/2016

## 2016-12-05 ENCOUNTER — Encounter (HOSPITAL_COMMUNITY): Payer: Self-pay | Admitting: Oncology

## 2016-12-05 ENCOUNTER — Emergency Department (HOSPITAL_COMMUNITY): Payer: BLUE CROSS/BLUE SHIELD

## 2016-12-05 ENCOUNTER — Emergency Department (HOSPITAL_COMMUNITY)
Admission: EM | Admit: 2016-12-05 | Discharge: 2016-12-05 | Disposition: A | Discharge status: Home / Self Care | Payer: BLUE CROSS/BLUE SHIELD | Attending: Physician Assistant | Admitting: Physician Assistant

## 2016-12-05 DIAGNOSIS — R0789 Other chest pain: Secondary | ICD-10-CM | POA: Diagnosis not present

## 2016-12-05 DIAGNOSIS — R109 Unspecified abdominal pain: Secondary | ICD-10-CM | POA: Diagnosis present

## 2016-12-05 DIAGNOSIS — F17200 Nicotine dependence, unspecified, uncomplicated: Secondary | ICD-10-CM | POA: Diagnosis not present

## 2016-12-05 DIAGNOSIS — Z7984 Long term (current) use of oral hypoglycemic drugs: Secondary | ICD-10-CM | POA: Diagnosis not present

## 2016-12-05 DIAGNOSIS — E119 Type 2 diabetes mellitus without complications: Secondary | ICD-10-CM | POA: Diagnosis not present

## 2016-12-05 DIAGNOSIS — M62830 Muscle spasm of back: Secondary | ICD-10-CM | POA: Insufficient documentation

## 2016-12-05 DIAGNOSIS — Z79899 Other long term (current) drug therapy: Secondary | ICD-10-CM | POA: Diagnosis not present

## 2016-12-05 LAB — URINALYSIS, ROUTINE W REFLEX MICROSCOPIC
Bilirubin Urine: NEGATIVE
Glucose, UA: NEGATIVE mg/dL
Hgb urine dipstick: NEGATIVE
KETONES UR: NEGATIVE mg/dL
NITRITE: NEGATIVE
PROTEIN: NEGATIVE mg/dL
Specific Gravity, Urine: <1.005 — ABNORMAL LOW (ref 1.005–1.030)
pH: 5.5 (ref 5.0–8.0)

## 2016-12-05 LAB — URINALYSIS, MICROSCOPIC (REFLEX)
BACTERIA UA: NONE SEEN
Squamous Epithelial / LPF: NONE SEEN

## 2016-12-05 MED ORDER — IBUPROFEN 800 MG PO TABS: 800.0000 mg | ORAL_TABLET | Freq: Three times a day (TID) | ORAL | 0 refills | Status: DC

## 2016-12-05 MED ORDER — OXYCODONE-ACETAMINOPHEN 5-325 MG PO TABS
1.0000 | ORAL_TABLET | Freq: Once | ORAL | Status: AC
  Administered 2016-12-05: 1 via ORAL
  Filled 2016-12-05: qty 1

## 2016-12-05 MED ORDER — CYCLOBENZAPRINE HCL 10 MG PO TABS: 10.0000 mg | ORAL_TABLET | Freq: Two times a day (BID) | ORAL | 0 refills | Status: DC | PRN

## 2016-12-05 NOTE — Discharge Instructions (Signed)
Please use ibuprofen and Flexeril to help with your muscle pain in your back. Please return with fever, nausea, vomiting or other concerns.

## 2016-12-05 NOTE — ED Notes (Signed)
ED Provider at bedside. 

## 2016-12-05 NOTE — ED Triage Notes (Signed)
Pt states he woke up Sunday morning w/ severe left flank pain.  Denies urinary sx.  States he has never had a kidney stone.  Pt rates pain 10/10.  Pt reports that pain is so bad he cannot walk.

## 2016-12-05 NOTE — ED Provider Notes (Signed)
[facility]-EMERGENCY DEPT Provider Note   CSN: [id number] Arrival date & time: 12/05/16  [id number]     History   Chief Complaint Chief Complaint  Patient presents with  . Flank Pain    HPI Kenneth Roth is a 51 y.o. male.  HPI   Patient with pain to the left flank/chest wall. Started yesterday when he woke. Patient denies any lifting anything unusual. Patient states worse with movement. Or with palpation. Patient denies any fever nausea vomiting or urinary symptoms.  Past Medical History:  Diagnosis Date  . Diabetes mellitus without complication [facility](HCC)     Patient Active Problem List   Diagnosis Date Noted  . History of poliomyelitis 08/22/2016  . Hyperlipidemia 08/22/2016  . Type 2 diabetes mellitus without complication, without long-term current use of insulin (HCC) 08/22/2016  . HERNIA 09/04/2007    Past Surgical History:  Procedure Laterality Date  . HERNIA REPAIR         Home Medications    Prior to Admission medications   Medication Sig Start Date End Date Taking? Authorizing Provider  atorvastatin (LIPITOR) 40 MG tablet Take 0.5 tablets (20 mg total) by mouth daily. 08/22/16   Peyton Najjaravid H Hopper, MD  cyclobenzaprine (FLEXERIL) 10 MG tablet Take 1 tablet (10 mg total) by mouth 2 (two) times daily as needed for muscle spasms. 12/05/16   Carry Weesner Lyn Keonta Monceaux, MD  ibuprofen (ADVIL,MOTRIN) 800 MG tablet Take 1 tablet (800 mg total) by mouth 3 (three) times daily. 12/05/16   Adelei Scobey Lyn Kabeer Hoagland, MD  metFORMIN (GLUCOPHAGE-XR) 500 MG 24 hr tablet Take 2 in the morning and 1 in the evening for diabetes 08/29/16   Peyton Najjaravid H Hopper, MD    Family History Family History  Problem Relation Age of Onset  . Diabetes Mother   . Diabetes Sister   . Diabetes Brother   . Diabetes Sister     Social History Social History  Substance Use Topics  . Smoking status: Current Every Day Smoker  . Smokeless tobacco: Current User  . Alcohol use Yes     Comment: 1 beer every day     Allergies     Patient has no known allergies.   Review of Systems Review of Systems  Constitutional: Negative for fatigue and fever.  Cardiovascular: Negative for chest pain.  Gastrointestinal: Negative for abdominal distention and abdominal pain.  Genitourinary: Positive for flank pain.  All other systems reviewed and are negative.    Physical Exam Updated Vital Signs BP 135/81 (BP Location: Right Arm)   Pulse 83   Temp 98 F (36.7 C) (Oral)   Resp 16   Ht 5\' 7"  (1.702 m)   Wt 140 lb (63.5 kg)   SpO2 99%   BMI 21.93 kg/m   Physical Exam  Constitutional: He is oriented to person, place, and time. He appears well-nourished.  HENT:  Head: Normocephalic.  Eyes: Conjunctivae are normal.  Cardiovascular: Normal rate.   Pulmonary/Chest: Effort normal and breath sounds normal. No respiratory distress.  Tenderness to the posterior chest wall around ribs 11 and 12. Pain with movement and palpation. No evidence of vesicles, rash  Neurological: He is oriented to person, place, and time.  Skin: Skin is warm and dry. He is not diaphoretic.  Psychiatric: He has a normal mood and affect. His behavior is normal.     ED Treatments / Results  Labs (all labs ordered are listed, but only abnormal results are displayed) Labs Reviewed  URINALYSIS, ROUTINE W REFLEX MICROSCOPIC -  Abnormal; Notable for the following:       Result Value   Specific Gravity, Urine <1.005 (*)    Leukocytes, UA TRACE (*)    All other components within normal limits  URINALYSIS, MICROSCOPIC (REFLEX)    EKG  EKG Interpretation None       Radiology Dg Chest 2 View  Result Date: 12/05/2016 CLINICAL DATA:  Severe left flank pain. EXAM: CHEST  2 VIEW COMPARISON:  06/27/2016.  02/03/2008. FINDINGS: Mediastinum hilar structures normal. Lungs are clear. No pleural effusion or pneumothorax. Stable mild elevation left hemidiaphragm. Heart size normal. Stable mild lower thoracic compression, no change from prior exams. No  acute bony abnormality identified. IMPRESSION: No acute cardiopulmonary disease. Electronically Signed   By: Maisie Fus  Register   On: 12/05/2016 07:50    Procedures Procedures (including critical care time)  Medications Ordered in ED Medications  oxyCODONE-acetaminophen (PERCOCET/ROXICET) 5-325 MG per tablet 1 tablet (1 tablet Oral Given 12/05/16 0747)     Initial Impression / Assessment and Plan / ED Course  I have reviewed the triage vital signs and the nursing notes.  Pertinent labs & imaging results that were available during my care of the patient were reviewed by me and considered in my medical decision making (see chart for details).  Clinical Course     Patient is a 51 year old male presenting with left flank/chest wall pain posteriorly. Patient has tenderness with palpation. Worse with movement. I believe is muscular skeletal nature. We'll make sure the patient does not have an urinary infection by doing a UA, we'll check for occult pneumonia with chest x-ray. Patient has normal vital signs, normal physical exam. If her tenderness on the posterior chest wall. We'll treat with ibuprofen and Flexeril and have patient follow up with primary care physician.  Final Clinical Impressions(s) / ED Diagnoses   Final diagnoses:  Back muscle spasm    New Prescriptions Discharge Medication List as of 12/05/2016  8:23 AM    START taking these medications   Details  cyclobenzaprine (FLEXERIL) 10 MG tablet Take 1 tablet (10 mg total) by mouth 2 (two) times daily as needed for muscle spasms., Starting Mon 12/05/2016, Print    ibuprofen (ADVIL,MOTRIN) 800 MG tablet Take 1 tablet (800 mg total) by mouth 3 (three) times daily., Starting Mon 12/05/2016, Print         Wendell Fiebig Randall An, MD 12/05/16 1630

## 2016-12-19 ENCOUNTER — Ambulatory Visit (INDEPENDENT_AMBULATORY_CARE_PROVIDER_SITE_OTHER): Payer: BLUE CROSS/BLUE SHIELD | Admitting: Family Medicine

## 2016-12-19 VITALS — BP 126/84 | HR 84 | Temp 97.4°F | Resp 18 | Ht 67.0 in | Wt 124.0 lb

## 2016-12-19 DIAGNOSIS — L600 Ingrowing nail: Secondary | ICD-10-CM

## 2016-12-19 DIAGNOSIS — R109 Unspecified abdominal pain: Secondary | ICD-10-CM | POA: Diagnosis not present

## 2016-12-19 DIAGNOSIS — L235 Allergic contact dermatitis due to other chemical products: Secondary | ICD-10-CM | POA: Diagnosis not present

## 2016-12-19 LAB — POC MICROSCOPIC URINALYSIS (UMFC): MUCUS RE: ABSENT

## 2016-12-19 LAB — POCT URINALYSIS DIP (MANUAL ENTRY)
BILIRUBIN UA: NEGATIVE
Blood, UA: NEGATIVE
GLUCOSE UA: NEGATIVE
Ketones, POC UA: NEGATIVE
NITRITE UA: NEGATIVE
Protein Ur, POC: NEGATIVE
Spec Grav, UA: 1.015
Urobilinogen, UA: 0.2
pH, UA: 5.5

## 2016-12-19 MED ORDER — HYDROCORTISONE 2.5 % EX CREA: TOPICAL_CREAM | Freq: Two times a day (BID) | CUTANEOUS | 0 refills | Status: DC

## 2016-12-19 MED ORDER — GABAPENTIN 300 MG PO CAPS: 300.0000 mg | ORAL_CAPSULE | Freq: Three times a day (TID) | ORAL | 0 refills | Status: DC

## 2016-12-19 NOTE — Progress Notes (Signed)
Chief Complaint  Patient presents with  . Leg Pain    LEFT   . Rash    LEFT SIDE    HPI  Pt reports that he started having left back pain 3 weeks ago He went to the ER for the pain.  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 Reports that he was discharged home with diagnosis of muscle pain He reports that the pain is worse with movement He put some pain cream 5 days ago and developed a rash  He used tiger balm for topical pain He started cortisone 10 for the rash which is itchy and there was no improvement  His pain is currently 9/10 He reports that the pain is 10/10 with movement Pt notes that the pain is sharp and burning  He also has a right 5th digit ingrown toenail that is painful No infection No redness No injury   Past Medical History:  Diagnosis Date  . Diabetes mellitus without complication [facility](HCC)     Current Outpatient Prescriptions  Medication Sig Dispense Refill  . atorvastatin (LIPITOR) 40 MG tablet Take 0.5 tablets (20 mg total) by mouth daily. 90 tablet 3  . cyclobenzaprine (FLEXERIL) 10 MG tablet Take 1 tablet (10 mg total) by mouth 2 (two) times daily as needed for muscle spasms. 10 tablet 0  . ibuprofen (ADVIL,MOTRIN) 800 MG tablet Take 1 tablet (800 mg total) by mouth 3 (three) times daily. 21 tablet 0  . metFORMIN (GLUCOPHAGE-XR) 500 MG 24 hr tablet Take 2 in the morning and 1 in the evening for diabetes 90 tablet 5  . gabapentin (NEURONTIN) 300 MG capsule Take 1 capsule (300 mg total) by mouth 3 (three) times daily. 90 capsule 0  . hydrocortisone 2.5 % cream Apply topically 2 (two) times daily. 30 g 0   No current facility-administered medications for this visit.     Allergies: No Known Allergies  Past Surgical History:  Procedure Laterality Date  . HERNIA REPAIR      Social History   Social History  . Marital status: Married    Spouse name: N/A  . Number of children: N/A  . Years of education: N/A   Social History  Main Topics  . Smoking status: Current Every Day Smoker  . Smokeless tobacco: Current User  . Alcohol use Yes     Comment: 1 beer every day  . Drug use: No  . Sexual activity: Not Asked   Other Topics Concern  . None   Social History Narrative  . None    Review of Systems  Constitutional: Negative for chills and fever.  Genitourinary: Negative for dysuria, frequency and urgency.  Musculoskeletal:       See hpi    Objective: Vitals:   12/19/16 0905  BP: 126/84  Pulse: 84  Resp: 18  Temp: 97.4 F (36.3 C)  TempSrc: Oral  SpO2: 99%  Weight: 124 lb (56.2 kg)  Height: 5\' 7"  (1.702 m)    Physical Exam  Constitutional: He appears well-developed and well-nourished.  HENT:  Head: Normocephalic and atraumatic.  Eyes: Conjunctivae and EOM are normal.  Cardiovascular: Normal rate, regular rhythm and normal heart sounds.   Pulmonary/Chest: Effort normal and breath sounds normal. No respiratory distress. He has no wheezes.  Abdominal: Soft. Bowel sounds are normal. He exhibits no distension. There is no tenderness.  Musculoskeletal:       Back:     Foot exam: right foot with 5th digit  ingrown toenail without erythema or edema  CLINICAL DATA:  Severe left flank pain.  EXAM: CHEST  2 VIEW  COMPARISON:  06/27/2016.  02/03/2008.  FINDINGS: Mediastinum hilar structures normal. Lungs are clear. No pleural effusion or pneumothorax. Stable mild elevation left hemidiaphragm. Heart size normal. Stable mild lower thoracic compression, no change from prior exams. No acute bony abnormality identified.  IMPRESSION: No acute cardiopulmonary disease.   Electronically Signed   By: Maisie Fus  Register   On: 12/05/2016 07:50  UA in the ER was negative for nitrites, wbc or blood    Assessment and Plan Kenneth Roth was seen today for leg pain and rash.  Diagnoses and all orders for this visit:  Chemical induced allergic contact dermatitis- discussed that he likely had  dermatitis from contact with the tiger balm -     hydrocortisone 2.5 % cream; Apply topically 2 (two) times daily.    Left flank pain- will send for physical therapy for muscle spasms No s/sx of shingles Will try gabapentin for pain since pt is also a diabetic  -     Ambulatory referral to Physical Therapy -     POCT Microscopic Urinalysis ([facility]) -     POCT urinalysis dipstick -     Care order/instruction: -     gabapentin (NEURONTIN) 300 MG capsule; Take 1 capsule (300 mg total) by mouth 3 (three) times daily.   Ingrown toenail without infection- diabetic with ingrown toenail so advised podiatry follow up -     Ambulatory referral to Podiatry       Kenneth Roth

## 2016-12-19 NOTE — Patient Instructions (Addendum)
   IF you received an x-ray today, you will receive an invoice from [facility]. Please contact [facility] at [phone] with questions or concerns regarding your invoice.   IF you received labwork today, you will receive an invoice from [facility]. Please contact [facility] at [phone] with questions or concerns regarding your invoice.   Our billing staff will not be able to assist you with questions regarding bills from these companies.  You will be contacted with the lab results as soon as they are available. The fastest way to get your results is to activate your My Chart account. Instructions are located on the last page of this paperwork. If you have not heard from us regarding the results in 2 weeks, please contact this office.     Ingrown Toenail An ingrown toenail occurs when the corner or sides of your toenail grow into the surrounding skin. The big toe is most commonly affected, but it can happen to any of your toes. If your ingrown toenail is not treated, you will be at risk for infection. What are the causes? This condition may be caused by:  Wearing shoes that are too small or tight.  Injury or trauma, such as stubbing your toe or having your toe stepped on.  Improper cutting or care of your toenails.  Being born with (congenital) nail or foot abnormalities, such as having a nail that is too big for your toe. What increases the risk? Risk factors for an ingrown toenail include:  Age. Your nails tend to thicken as you get older, so ingrown nails are more common in older people.  Diabetes.  Cutting your toenails incorrectly.  Blood circulation problems. What are the signs or symptoms? Symptoms may include:  Pain, soreness, or tenderness.  Redness.  Swelling.  Hardening of the skin surrounding the toe. Your ingrown toenail may be infected if there is fluid, pus, or drainage. How is this diagnosed? An ingrown toenail may be diagnosed by  medical history and physical exam. If your toenail is infected, your health care provider may test a sample of the drainage. How is this treated? Treatment depends on the severity of your ingrown toenail. Some ingrown toenails may be treated at home. More severe or infected ingrown toenails may require surgery to remove all or part of the nail. Infected ingrown toenails may also be treated with antibiotic medicines. Follow these instructions at home:  If you were prescribed an antibiotic medicine, finish all of it even if you start to feel better.  Soak your foot in warm soapy water for 20 minutes, 3 times per day or as directed by your health care provider.  Carefully lift the edge of the nail away from the sore skin by wedging a small piece of cotton under the corner of the nail. This may help with the pain. Be careful not to cause more injury to the area.  Wear shoes that fit well. If your ingrown toenail is causing you pain, try wearing sandals, if possible.  Trim your toenails regularly and carefully. Do not cut them in a curved shape. Cut your toenails straight across. This prevents injury to the skin at the corners of the toenail.  Keep your feet clean and dry.  If you are having trouble walking and are given crutches by your health care provider, use them as directed.  Do not pick at your toenail or try to remove it yourself.  Take medicines only as directed by your health care provider.    Keep all follow-up visits as directed by your health care provider. This is important. Contact a health care provider if:  Your symptoms do not improve with treatment. Get help right away if:  You have red streaks that start at your foot and go up your leg.  You have a fever.  You have increased redness, swelling, or pain.  You have fluid, blood, or pus coming from your toenail. This information is not intended to replace advice given to you by your health care provider. Make sure you  discuss any questions you have with your health care provider. Document Released: 11/11/2000 Document Revised: 04/15/2016 Document Reviewed: 10/08/2014 Elsevier Interactive Patient Education  2017 Elsevier Inc.  

## 2016-12-21 ENCOUNTER — Ambulatory Visit: Payer: BLUE CROSS/BLUE SHIELD | Admitting: Podiatry

## 2016-12-26 ENCOUNTER — Ambulatory Visit: Payer: BLUE CROSS/BLUE SHIELD | Attending: Family Medicine | Admitting: Physical Therapy

## 2016-12-26 ENCOUNTER — Encounter: Payer: Self-pay | Admitting: Physical Therapy

## 2016-12-26 DIAGNOSIS — M62830 Muscle spasm of back: Secondary | ICD-10-CM | POA: Diagnosis not present

## 2016-12-26 DIAGNOSIS — M6281 Muscle weakness (generalized): Secondary | ICD-10-CM | POA: Insufficient documentation

## 2016-12-26 NOTE — Patient Instructions (Signed)
Chair Sitting    Sit at edge of seat, spine straight, one leg extended. Put a hand on each thigh and bend forward from the hip, keeping spine straight. Allow hand on extended leg to reach toward toes. Support upper body with other arm. Hold _30__ seconds. Repeat __3_ times per session. Do __1_ sessions per day.  Copyright  VHI. All rights reserved.    Then pull toes back with strap - 3 x 30 sec  Then bending to the side  [facility] [address], [address] [place], [place] [address] Phone # [phone] Fax [phone]

## 2016-12-27 NOTE — Therapy (Signed)
[facility] [address] [place], [place], [address] Phone: [phone]   Fax:  [phone]  Physical Therapy Evaluation  Patient Details  Name: Kenneth Roth MRN: [id number] Date of Birth: 03/10/1966 Referring Provider: Doristine Bosworth, MD  Encounter Date: 12/26/2016      PT End of Session - 12/26/16 1803    Visit Number 1   Date for PT Re-Evaluation 03/20/17   PT Start Time 1150   PT Stop Time 1233   PT Time Calculation (min) 43 min   Activity Tolerance Patient tolerated treatment well   Behavior During Therapy [facility] for tasks assessed/performed      Past Medical History:  Diagnosis Date  . Diabetes mellitus without complication [facility])     Past Surgical History:  Procedure Laterality Date  . HERNIA REPAIR      There were no vitals filed for this visit.       Subjective Assessment - 12/26/16 1154    Subjective Pt reports his back just started hurting one morning.  Reports had no problem sleeping that night and just woke up like this.  Has trouble sleeping now and pain all the time.  Has had shorter leg since age [age] y/o but never had back pain like this.     Pertinent History leg length discrepancy   Limitations Sitting;Standing;Walking   Patient Stated Goals get rid of pain   Currently in Pain? Yes   Pain Score 8    Pain Location Flank   Pain Orientation Left   Pain Descriptors / Indicators Sharp   Pain Type Acute pain   Pain Radiating Towards calf   Pain Onset 1 to 4 weeks ago   Aggravating Factors  walking, moving   Pain Relieving Factors pain medication   Effect of Pain on Daily Activities can't sleep sometimes, walking,    Multiple Pain Sites No            [facility] PT Assessment - 12/27/16 0001      Assessment   Medical Diagnosis R10.9 Left flank pain   Onset Date/Surgical Date 11/28/16   Prior Therapy no     Precautions   Precautions None     Restrictions   Weight Bearing  Restrictions No     Home Environment   Living Environment Private residence     Prior Function   Level of Independence Independent   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     Cognition   Overall Cognitive Status Within Functional Limits for tasks assessed     Observation/Other Assessments   Focus on Therapeutic Outcomes (FOTO)  67% limitation  goal 43%     AROM   Lumbar Flexion 50% limitation   Lumbar Extension 75% limitation   Lumbar - Right Side Bend 50% limitation   Lumbar - Left Side Bend 50% limitation   Thoracic Flexion 25% limitation   Thoracic Extension 75% limitation   Thoracic - Right Side Bend 75% limitation - spine already flexed to the Rt side  increased pain   Thoracic - Left Side Bend 50% limitation   Thoracic - Right Rotation 75% limition; slightly rotated posture  increased pain   Thoracic - Left Rotation 50 % limitation     Strength   Right Hip Flexion 4-/5   Right Hip Extension 4/5   Right Hip External Rotation  4/5   Right Hip Internal Rotation 4/5   Right Hip ABduction 3/5  Right Hip ADduction 3/5   Left Hip Flexion 2/5   Left Hip Extension 2/5   Left Hip External Rotation 2/5   Left Hip Internal Rotation 2/5   Left Hip ABduction 2/5   Left Hip ADduction 2/5     Palpation   Palpation comment tender to palpation T4-S1 intercostals and paraspinals bilateral; pt tolerates partially prone position with Ltside slightly elevated                   [facility] Adult PT Treatment/Exercise - 12/27/16 0001      Ambulation/Gait   Gait Pattern Antalgic;Trendelenburg;Lateral trunk lean to right;Left circumduction   Gait Comments Lt LE circumduction and swings around using momentum due to Lt LE weakness and decreased leg length on Lt LE     Posture/Postural Control   Posture/Postural Control Postural limitations   Postural Limitations Flexed trunk;Left pelvic obliquity  functional scoliosis due to leg length  discrepency   Posture Comments Lt LE 82.5 cm/ Rt LE 86.5 cm     Lumbar Exercises: Stretches   Active Hamstring Stretch 3 reps;30 seconds     Lumbar Exercises: Seated   Other Seated Lumbar Exercises calf stretch 3x20sec; flexion and SB to Lt for thoracic mobility     Manual Therapy   Manual Therapy Muscle Energy Technique;Soft tissue mobilization   Soft tissue mobilization Rt lumbar paraspinals and QL   Muscle Energy Technique isometric Resisted Rt side bend/rotation in Lt flexion                PT Education - 12/27/16 1028    Education provided Yes   Education Details stretches and thoracic self mobilization for opening on Rt side   Person(s) Educated Patient   Methods Explanation;Demonstration;Verbal cues;Handout;Tactile cues   Comprehension Verbalized understanding;Returned demonstration          PT Short Term Goals - 12/27/16 1116      PT SHORT TERM GOAL #1   Title Pt will be independent with initial HEP   Time 4   Period Weeks   Status New     PT SHORT TERM GOAL #2   Title 25% reduced pain during ambulation for ability to perform functional tasks more efficiantly   Time 4   Period Weeks   Status New     PT SHORT TERM GOAL #3   Title demonstrate AROM lumbar side bending without increased pain to demonstrate improved mobility for functional movements   Time 4   Period Weeks   Status New           PT Long Term Goals - 12/27/16 1116      PT LONG TERM GOAL #1   Title FOTO < or = to 43% limitation   Time 12   Period Weeks   Status New     PT LONG TERM GOAL #2   Title Pt will report 60% reduction in pain or less than 4/10 pain while at work.   Time 12   Period Weeks   Status New     PT LONG TERM GOAL #3   Title independent with advanced HEP   Time 12   Period Weeks   Status New     PT LONG TERM GOAL #4   Title demonstrate improved upright posture in sitting due to improved muscle length of spasming muscle   Time 12   Period Weeks    Status New  Plan - 12/27/16 1030    Clinical Impression Statement Pt presents to clinic for moderate complex eval due to leg length discrepancy, assment needed for gait, hip, thoracic and lumbar spine as well as knee and ankle.  Pt also having condition worsening with pain now going down Lt side into lower leg.  Pt has severe muscle spasms along spine from T4 to S1 and Lt flank pain that has started 4 weeks ago.  Pt has history of leg length discrepancey causing abnormal gait and scoliosis of the spine.  Thoracic and lumbar AROM limited from 25%-75% throughout. Pt has increased pain with extension and Rt side bending.  Pt responded well to Lt side bend, Lt flexion and Lt rotation for opening of Rt thoracic vertbrae.  Pt experiencing pain of 8/10 currently at Lt flank and down into Lt calf.  Pt will benefit from skilled PT to address impairments so patient can perform job related tasks with less discomfort.   Rehab Potential Good   Clinical Impairments Affecting Rehab Potential leg length discrepancy   PT Frequency 1x / week   PT Duration 12 weeks   PT Treatment/Interventions ADLs/Self Care Home Management;Biofeedback;Cryotherapy;Electrical Stimulation;Moist Heat;Therapeutic activities;Therapeutic exercise;Balance training;Neuromuscular re-education;Patient/family education;Manual techniques;Passive range of motion;Taping   PT Next Visit Plan F/U with Rt thoracic opening exercises, LE and hip strengthening, manual techiques for reduced muscle spasms, F/U with patient about heel lift   Consulted and Agree with Plan of Care Patient      Patient will benefit from skilled therapeutic intervention in order to improve the following deficits and impairments:  Abnormal gait, Decreased endurance, Decreased range of motion, Difficulty walking, Decreased strength, Hypomobility, Pain, Increased muscle spasms, Postural dysfunction  Visit Diagnosis: Muscle spasm of back  Muscle weakness  (generalized)     Problem List Patient Active Problem List   Diagnosis Date Noted  . History of poliomyelitis 08/22/2016  . Hyperlipidemia 08/22/2016  . Type 2 diabetes mellitus without complication, without long-term current use of insulin (HCC) 08/22/2016  . HERNIA 09/04/2007    Vincente Poli, PT 12/27/2016, 12:06 PM  [facility] [address] [place], [place], [address] Phone: [phone]   Fax:  [phone]  Name: Kenneth Roth MRN: [id number] Date of Birth: [birth year]

## 2016-12-28 ENCOUNTER — Encounter: Payer: Self-pay | Admitting: Podiatry

## 2016-12-28 ENCOUNTER — Ambulatory Visit (INDEPENDENT_AMBULATORY_CARE_PROVIDER_SITE_OTHER): Payer: BLUE CROSS/BLUE SHIELD | Admitting: Podiatry

## 2016-12-28 VITALS — Ht 67.0 in | Wt 124.0 lb

## 2016-12-28 DIAGNOSIS — M7751 Other enthesopathy of right foot: Secondary | ICD-10-CM

## 2016-12-28 DIAGNOSIS — M79671 Pain in right foot: Secondary | ICD-10-CM | POA: Diagnosis not present

## 2016-12-28 NOTE — Patient Instructions (Signed)
Seen for pain in right 5th toe. Noted of deformed nail and digital corn at distal end. Noted of compromised foot circulation on right.  Lesion debrided. Pain relieved. Return as needed.

## 2016-12-28 NOTE — Progress Notes (Signed)
SUBJECTIVE: 51 y.o. year old male presents complaining of pain at distal lateral end of left 5th toe for 3 months.  Had polio at age [age]. Has under developed right lower limb. Walking with limping gait. Used to have brace for right lower limb, but could not use it. It was more problem for him to use. He walks with limping gait and dragging right foot at times. Was diagnosed with diabetic 2 weeks ago.  REVIEW OF SYSTEMS: A comprehensive review of systems was negative except for: childhood polio.  OBJECTIVE: DERMATOLOGIC EXAMINATION: Painful deformed nail distal lateral 5th toe bilateral.  VASCULAR EXAMINATION OF LOWER LIMBS: Left foot pedal pulses are palpable with normal pulsation.  Right foot Dorsalis Pedis artery is not palpable, Posterior Tibial artery is palpable in normal pulsation. Capillary Filling times within 3 seconds in all digits.  Temperature gradient from tibial crest to dorsum of foot is within normal bilateral.  NEUROLOGIC EXAMINATION OF THE LOWER LIMBS: Achilles DTR is present and within normal. Monofilament (Semmes-Weinstein 10-gm) sensory testing positive 6 out of 6, bilateral. Vibratory sensations(128Hz  turning fork) intact at medial and lateral forefoot bilateral.  Sharp and Dull discriminatory sensations at the plantar ball of hallux is intact bilateral.   MUSCULOSKELETAL EXAMINATION: Varus rotated 5th toe right. Positive for under developed right lower limb. Minimum muscle strength anterior and posterior muscle group right. Unable to resist with force against dorsiflexion and plantar flexion of right foot.  ASSESSMENT: Painful deformed nail 5th digit bilateral. History of Childhood polio and under developed right lower limb. Difficulty walking, unable to lift right foot.  PLAN: 5th toe lesions debrided. All nails debrided. Return as needed.

## 2017-01-02 ENCOUNTER — Encounter: Payer: Self-pay | Admitting: Physical Therapy

## 2017-01-02 ENCOUNTER — Ambulatory Visit: Payer: BLUE CROSS/BLUE SHIELD | Attending: Family Medicine | Admitting: Physical Therapy

## 2017-01-02 DIAGNOSIS — M62830 Muscle spasm of back: Secondary | ICD-10-CM

## 2017-01-02 DIAGNOSIS — M6281 Muscle weakness (generalized): Secondary | ICD-10-CM | POA: Insufficient documentation

## 2017-01-02 NOTE — Therapy (Addendum)
[facility] [address] [place], [place], [address] Phone: [phone]   Fax:  [phone]  Physical Therapy Treatment  Patient Details  Name: Kenneth Roth MRN: [id number] Date of Birth: Nov 03, 1966 Referring Provider: Forrest Moron, MD  Encounter Date: 01/02/2017      PT End of Session - 01/02/17 1152    Visit Number 2   Date for PT Re-Evaluation 03/20/17   PT Start Time 1150   PT Stop Time 1237   PT Time Calculation (min) 47 min   Activity Tolerance Patient tolerated treatment well   Behavior During Therapy [facility] for tasks assessed/performed      Past Medical History:  Diagnosis Date  . Diabetes mellitus without complication [facility])     Past Surgical History:  Procedure Laterality Date  . HERNIA REPAIR      There were no vitals filed for this visit.      Subjective Assessment - 01/02/17 1150    Subjective Pt reports back hurting. Takes pain medication but still has pain constantly.    Pertinent History leg length discrepancy   Limitations Sitting;Standing;Walking   Patient Stated Goals get rid of pain   Currently in Pain? Yes   Pain Score 7    Pain Location Flank   Pain Orientation Left   Pain Descriptors / Indicators Sharp   Pain Type Acute pain   Pain Radiating Towards calf   Pain Onset 1 to 4 weeks ago   Pain Frequency Constant   Aggravating Factors  walking, moveing   Pain Relieving Factors pain medication   Effect of Pain on Daily Activities can't sleep sometimes, walking   Multiple Pain Sites No                         [facility] Adult PT Treatment/Exercise - 01/02/17 0001      Lumbar Exercises: Stretches   Active Hamstring Stretch 3 reps;30 seconds     Lumbar Exercises: Aerobic   Stationary Bike Nustep L1 x 6 minutes  Therapist present ot discuss treatment     Lumbar Exercises: Seated   Other Seated Lumbar Exercises calf stretch 3x20sec; flexion and SB to Lt for  thoracic mobility     Lumbar Exercises: Supine   Ab Set 10 reps   Clam 20 reps   Heel Slides 20 reps   Bent Knee Raise 20 reps   Bridge 20 reps   Other Supine Lumbar Exercises Ball squeeze     Modalities   Modalities Moist Heat;Electrical Stimulation     Moist Heat Therapy   Number Minutes Moist Heat 15 Minutes   Moist Heat Location Lumbar Spine  Right side     Electrical Stimulation   Electrical Stimulation Location Right side   Electrical Stimulation Action IFC   Electrical Stimulation Parameters 7 ma   Electrical Stimulation Goals Pain     Manual Therapy   Manual Therapy Muscle Energy Technique;Soft tissue mobilization   Muscle Energy Technique isometric Resisted Rt side bend/rotation in Lt flexion                  PT Short Term Goals - 01/02/17 1152      PT SHORT TERM GOAL #1   Title Pt will be independent with initial HEP   Time 4   Period Weeks   Status On-going     PT SHORT TERM GOAL #2   Title 25% reduced pain during ambulation for ability  to perform functional tasks more efficiantly   Time 4   Period Weeks   Status On-going     PT SHORT TERM GOAL #3   Title demonstrate AROM lumbar side bending without increased pain to demonstrate improved mobility for functional movements   Time 4   Period Weeks   Status On-going           PT Long Term Goals - 01/02/17 1152      PT LONG TERM GOAL #1   Title FOTO < or = to 43% limitation   Time 12   Period Weeks   Status On-going     PT LONG TERM GOAL #2   Title Pt will report 60% reduction in pain or less than 4/10 pain while at work.   Time 12   Period Weeks   Status On-going     PT LONG TERM GOAL #3   Title independent with advanced HEP   Time 12   Period Weeks   Status On-going     PT LONG TERM GOAL #4   Title demonstrate improved upright posture in sitting due to improved muscle length of spasming muscle   Time 12   Period Weeks   Status On-going               Plan -  01/02/17 1329    Clinical Impression Statement Pt presents with antalgic gait due to preexisting condition with significant leg length descrepency. Pt has little control of Rt LE. Able to tolerate all exercsies well with muscle fatigue and weakness but no increase in pain. Pt will continue to benefit from skilled therapy for LE and core strengthening.    Rehab Potential Good   Clinical Impairments Affecting Rehab Potential leg length discrepancy   PT Frequency 1x / week   PT Duration 12 weeks   PT Treatment/Interventions ADLs/Self Care Home Management;Biofeedback;Cryotherapy;Electrical Stimulation;Moist Heat;Therapeutic activities;Therapeutic exercise;Balance training;Neuromuscular re-education;Patient/family education;Manual techniques;Passive range of motion;Taping   PT Next Visit Plan LE and core strengthening, maunal techniques   Consulted and Agree with Plan of Care Patient      Patient will benefit from skilled therapeutic intervention in order to improve the following deficits and impairments:  Abnormal gait, Decreased endurance, Decreased range of motion, Difficulty walking, Decreased strength, Hypomobility, Pain, Increased muscle spasms, Postural dysfunction  Visit Diagnosis: Muscle spasm of back  Muscle weakness (generalized)     Problem List Patient Active Problem List   Diagnosis Date Noted  . History of poliomyelitis 08/22/2016  . Hyperlipidemia 08/22/2016  . Type 2 diabetes mellitus without complication, without long-term current use of insulin ([place]) 08/22/2016  . HERNIA 09/04/2007    Mikle Bosworth PTA 01/02/2017, 1:37 PM  [facility] [address], [place], [place], [address] Phone: [phone]   Fax:  [phone]  Name: Kenneth Roth MRN: [id number] Date of Birth: 06/02/1966  PHYSICAL THERAPY DISCHARGE SUMMARY  Visits from Start of Care: 2  Current functional level related to goals / functional  outcomes: See above goals   Remaining deficits: See above   Education / Equipment: HEP  Plan: Patient agrees to discharge.  Patient goals were not met. Patient is being discharged due to not returning since the last visit.  ?????         Google, PT 02/07/17 4:05 PM

## 2017-01-09 ENCOUNTER — Ambulatory Visit: Payer: BLUE CROSS/BLUE SHIELD | Admitting: Physical Therapy

## 2017-01-16 ENCOUNTER — Ambulatory Visit: Payer: BLUE CROSS/BLUE SHIELD | Admitting: Physical Therapy

## 2017-01-23 ENCOUNTER — Encounter: Payer: BLUE CROSS/BLUE SHIELD | Admitting: Physical Therapy

## 2017-01-29 ENCOUNTER — Inpatient Hospital Stay (HOSPITAL_COMMUNITY)
Admission: EM | Admit: 2017-01-29 | Discharge: 2017-01-31 | DRG: 470 | Disposition: A | Discharge status: Home Health | Payer: BLUE CROSS/BLUE SHIELD | Attending: Internal Medicine | Admitting: Internal Medicine

## 2017-01-29 ENCOUNTER — Inpatient Hospital Stay (HOSPITAL_COMMUNITY): Payer: BLUE CROSS/BLUE SHIELD

## 2017-01-29 ENCOUNTER — Encounter (HOSPITAL_COMMUNITY): Payer: Self-pay | Admitting: Emergency Medicine

## 2017-01-29 ENCOUNTER — Emergency Department (HOSPITAL_COMMUNITY): Payer: BLUE CROSS/BLUE SHIELD

## 2017-01-29 ENCOUNTER — Inpatient Hospital Stay (HOSPITAL_COMMUNITY): Payer: BLUE CROSS/BLUE SHIELD | Admitting: Anesthesiology

## 2017-01-29 ENCOUNTER — Encounter (HOSPITAL_COMMUNITY)
Admission: EM | Disposition: A | Discharge status: Home Health | Payer: Self-pay | Source: Home / Self Care | Attending: Internal Medicine

## 2017-01-29 DIAGNOSIS — S72001A Fracture of unspecified part of neck of right femur, initial encounter for closed fracture: Secondary | ICD-10-CM

## 2017-01-29 DIAGNOSIS — Z720 Tobacco use: Secondary | ICD-10-CM | POA: Diagnosis not present

## 2017-01-29 DIAGNOSIS — E785 Hyperlipidemia, unspecified: Secondary | ICD-10-CM | POA: Diagnosis present

## 2017-01-29 DIAGNOSIS — G14 Postpolio syndrome: Secondary | ICD-10-CM | POA: Diagnosis present

## 2017-01-29 DIAGNOSIS — E784 Other hyperlipidemia: Secondary | ICD-10-CM

## 2017-01-29 DIAGNOSIS — R64 Cachexia: Secondary | ICD-10-CM | POA: Diagnosis present

## 2017-01-29 DIAGNOSIS — F17290 Nicotine dependence, other tobacco product, uncomplicated: Secondary | ICD-10-CM | POA: Diagnosis present

## 2017-01-29 DIAGNOSIS — F17200 Nicotine dependence, unspecified, uncomplicated: Secondary | ICD-10-CM

## 2017-01-29 DIAGNOSIS — E119 Type 2 diabetes mellitus without complications: Secondary | ICD-10-CM

## 2017-01-29 DIAGNOSIS — F1011 Alcohol abuse, in remission: Secondary | ICD-10-CM

## 2017-01-29 DIAGNOSIS — F1010 Alcohol abuse, uncomplicated: Secondary | ICD-10-CM | POA: Diagnosis present

## 2017-01-29 DIAGNOSIS — Z6821 Body mass index (BMI) 21.0-21.9, adult: Secondary | ICD-10-CM

## 2017-01-29 DIAGNOSIS — D72829 Elevated white blood cell count, unspecified: Secondary | ICD-10-CM | POA: Diagnosis present

## 2017-01-29 DIAGNOSIS — Z7984 Long term (current) use of oral hypoglycemic drugs: Secondary | ICD-10-CM | POA: Diagnosis not present

## 2017-01-29 DIAGNOSIS — R634 Abnormal weight loss: Secondary | ICD-10-CM | POA: Diagnosis not present

## 2017-01-29 DIAGNOSIS — Z79899 Other long term (current) drug therapy: Secondary | ICD-10-CM | POA: Diagnosis not present

## 2017-01-29 HISTORY — DX: Fracture of unspecified part of neck of right femur, initial encounter for closed fracture: S72.001A

## 2017-01-29 HISTORY — DX: Acute poliomyelitis, unspecified: A80.9

## 2017-01-29 HISTORY — PX: HIP ARTHROPLASTY: SHX981

## 2017-01-29 LAB — CBC WITH DIFFERENTIAL/PLATELET
BASOS ABS: 0 10*3/uL (ref 0.0–0.1)
Basophils Absolute: 0 10*3/uL (ref 0.0–0.1)
Basophils Relative: 0 %
Basophils Relative: 0 %
EOS ABS: 0.1 10*3/uL (ref 0.0–0.7)
EOS PCT: 1 %
EOS PCT: 1 %
Eosinophils Absolute: 0.1 10*3/uL (ref 0.0–0.7)
HCT: 39.6 % (ref 39.0–52.0)
HEMATOCRIT: 40.2 % (ref 39.0–52.0)
Hemoglobin: 14.3 g/dL (ref 13.0–17.0)
Hemoglobin: 13.7 g/dL (ref 13.0–17.0)
LYMPHS ABS: 1.9 10*3/uL (ref 0.7–4.0)
LYMPHS ABS: 1.5 10*3/uL (ref 0.7–4.0)
LYMPHS PCT: 14 %
LYMPHS PCT: 15 %
MCH: 32 pg (ref 26.0–34.0)
MCH: 31.7 pg (ref 26.0–34.0)
MCHC: 35.6 g/dL (ref 30.0–36.0)
MCHC: 34.6 g/dL (ref 30.0–36.0)
MCV: 89.9 fL (ref 78.0–100.0)
MCV: 91.7 fL (ref 78.0–100.0)
MONO ABS: 0.9 10*3/uL (ref 0.1–1.0)
MONO ABS: 1 10*3/uL (ref 0.1–1.0)
MONOS PCT: 7 %
Monocytes Relative: 11 %
NEUTROS ABS: 10.8 10*3/uL — AB (ref 1.7–7.7)
Neutro Abs: 7.3 10*3/uL (ref 1.7–7.7)
Neutrophils Relative %: 78 %
Neutrophils Relative %: 73 %
PLATELETS: 206 10*3/uL (ref 150–400)
PLATELETS: 184 10*3/uL (ref 150–400)
RBC: 4.47 MIL/uL (ref 4.22–5.81)
RBC: 4.32 MIL/uL (ref 4.22–5.81)
RDW: 12.9 % (ref 11.5–15.5)
RDW: 12.8 % (ref 11.5–15.5)
WBC: 13.8 10*3/uL — ABNORMAL HIGH (ref 4.0–10.5)
WBC: 9.9 10*3/uL (ref 4.0–10.5)

## 2017-01-29 LAB — TSH: TSH: 3.177 u[IU]/mL (ref 0.350–4.500)

## 2017-01-29 LAB — HEPATIC FUNCTION PANEL
ALT: 36 U/L (ref 17–63)
AST: 26 U/L (ref 15–41)
Albumin: 3.6 g/dL (ref 3.5–5.0)
Alkaline Phosphatase: 92 U/L (ref 38–126)
BILIRUBIN DIRECT: 0.1 mg/dL (ref 0.1–0.5)
BILIRUBIN INDIRECT: 0.7 mg/dL (ref 0.3–0.9)
TOTAL PROTEIN: 5.9 g/dL — AB (ref 6.5–8.1)
Total Bilirubin: 0.8 mg/dL (ref 0.3–1.2)

## 2017-01-29 LAB — GLUCOSE, CAPILLARY
GLUCOSE-CAPILLARY: 125 mg/dL — AB (ref 65–99)
GLUCOSE-CAPILLARY: 127 mg/dL — AB (ref 65–99)
GLUCOSE-CAPILLARY: 244 mg/dL — AB (ref 65–99)
Glucose-Capillary: 163 mg/dL — ABNORMAL HIGH (ref 65–99)

## 2017-01-29 LAB — SURGICAL PCR SCREEN
MRSA, PCR: NEGATIVE
Staphylococcus aureus: NEGATIVE

## 2017-01-29 LAB — I-STAT CHEM 8, ED
BUN: 12 mg/dL (ref 6–20)
CREATININE: 0.8 mg/dL (ref 0.61–1.24)
Calcium, Ion: 1.14 mmol/L — ABNORMAL LOW (ref 1.15–1.40)
Chloride: 102 mmol/L (ref 101–111)
GLUCOSE: 159 mg/dL — AB (ref 65–99)
HCT: 42 % (ref 39.0–52.0)
HEMOGLOBIN: 14.3 g/dL (ref 13.0–17.0)
Potassium: 3.6 mmol/L (ref 3.5–5.1)
Sodium: 141 mmol/L (ref 135–145)
TCO2: 23 mmol/L (ref 0–100)

## 2017-01-29 LAB — TYPE AND SCREEN
ABO/RH(D): B POS
Antibody Screen: NEGATIVE

## 2017-01-29 LAB — CBG MONITORING, ED: GLUCOSE-CAPILLARY: 136 mg/dL — AB (ref 65–99)

## 2017-01-29 LAB — ABO/RH: ABO/RH(D): B POS

## 2017-01-29 SURGERY — HEMIARTHROPLASTY, HIP, DIRECT ANTERIOR APPROACH, FOR FRACTURE
Anesthesia: General | Site: Leg Upper | Laterality: Right

## 2017-01-29 MED ORDER — PROPOFOL 10 MG/ML IV BOLUS
INTRAVENOUS | Status: DC | PRN
  Administered 2017-01-29: 150 mg via INTRAVENOUS

## 2017-01-29 MED ORDER — KETOROLAC TROMETHAMINE 15 MG/ML IJ SOLN
15.0000 mg | Freq: Four times a day (QID) | INTRAMUSCULAR | Status: AC
  Administered 2017-01-29 – 2017-01-30 (×4): 15 mg via INTRAVENOUS
  Filled 2017-01-29 (×4): qty 1

## 2017-01-29 MED ORDER — LACTATED RINGERS IV SOLN: INTRAVENOUS | Status: DC

## 2017-01-29 MED ORDER — PROPOFOL 10 MG/ML IV BOLUS
INTRAVENOUS | Status: AC
  Filled 2017-01-29: qty 20

## 2017-01-29 MED ORDER — POLYETHYLENE GLYCOL 3350 17 G PO PACK: 17.0000 g | PACK | Freq: Every day | ORAL | Status: DC | PRN

## 2017-01-29 MED ORDER — MAGNESIUM CITRATE PO SOLN: 1.0000 | Freq: Once | ORAL | Status: DC | PRN

## 2017-01-29 MED ORDER — DEXTROSE 5 % IV SOLN
500.0000 mg | Freq: Four times a day (QID) | INTRAVENOUS | Status: DC | PRN
  Filled 2017-01-29: qty 5

## 2017-01-29 MED ORDER — HYDROMORPHONE HCL 1 MG/ML IJ SOLN
0.2500 mg | INTRAMUSCULAR | Status: DC | PRN
  Administered 2017-01-29 (×4): 0.5 mg via INTRAVENOUS

## 2017-01-29 MED ORDER — HYDROCODONE-ACETAMINOPHEN 5-325 MG PO TABS: 1.0000 | ORAL_TABLET | ORAL | Status: DC | PRN

## 2017-01-29 MED ORDER — MORPHINE SULFATE (PF) 2 MG/ML IV SOLN
0.5000 mg | INTRAVENOUS | Status: DC | PRN
  Administered 2017-01-30 – 2017-01-31 (×8): 0.5 mg via INTRAVENOUS
  Filled 2017-01-29 (×8): qty 1

## 2017-01-29 MED ORDER — POVIDONE-IODINE 10 % EX SWAB: 2.0000 "application " | Freq: Once | CUTANEOUS | Status: DC

## 2017-01-29 MED ORDER — HYDROMORPHONE HCL 1 MG/ML IJ SOLN
INTRAMUSCULAR | Status: AC
  Administered 2017-01-29: 0.5 mg via INTRAVENOUS
  Filled 2017-01-29: qty 1

## 2017-01-29 MED ORDER — INSULIN ASPART 100 UNIT/ML ~~LOC~~ SOLN
0.0000 [IU] | Freq: Three times a day (TID) | SUBCUTANEOUS | Status: DC
  Administered 2017-01-29 – 2017-01-30 (×3): 2 [IU] via SUBCUTANEOUS
  Administered 2017-01-30: 5 [IU] via SUBCUTANEOUS
  Administered 2017-01-31: 3 [IU] via SUBCUTANEOUS

## 2017-01-29 MED ORDER — SODIUM CHLORIDE 0.9 % IV SOLN: Freq: Once | INTRAVENOUS | Status: DC

## 2017-01-29 MED ORDER — ROCURONIUM BROMIDE 50 MG/5ML IV SOSY
PREFILLED_SYRINGE | INTRAVENOUS | Status: AC
  Filled 2017-01-29: qty 5

## 2017-01-29 MED ORDER — ONDANSETRON HCL 4 MG/2ML IJ SOLN: 4.0000 mg | Freq: Four times a day (QID) | INTRAMUSCULAR | Status: DC | PRN

## 2017-01-29 MED ORDER — ACETAMINOPHEN 325 MG PO TABS
650.0000 mg | ORAL_TABLET | Freq: Four times a day (QID) | ORAL | Status: DC | PRN
  Administered 2017-01-30: 650 mg via ORAL
  Filled 2017-01-29: qty 2

## 2017-01-29 MED ORDER — OXYCODONE HCL 5 MG PO TABS: 5.0000 mg | ORAL_TABLET | Freq: Once | ORAL | Status: DC | PRN

## 2017-01-29 MED ORDER — MENTHOL 3 MG MT LOZG: 1.0000 | LOZENGE | OROMUCOSAL | Status: DC | PRN

## 2017-01-29 MED ORDER — LACTATED RINGERS IV SOLN
INTRAVENOUS | Status: DC
  Administered 2017-01-29: 10:00:00 via INTRAVENOUS

## 2017-01-29 MED ORDER — METOCLOPRAMIDE HCL 5 MG PO TABS: 5.0000 mg | ORAL_TABLET | Freq: Three times a day (TID) | ORAL | Status: DC | PRN

## 2017-01-29 MED ORDER — ENOXAPARIN SODIUM 40 MG/0.4ML ~~LOC~~ SOLN
40.0000 mg | SUBCUTANEOUS | Status: DC
  Filled 2017-01-29: qty 0.4

## 2017-01-29 MED ORDER — LORAZEPAM 2 MG/ML IJ SOLN: 1.0000 mg | Freq: Four times a day (QID) | INTRAMUSCULAR | Status: DC | PRN

## 2017-01-29 MED ORDER — ADULT MULTIVITAMIN W/MINERALS CH
1.0000 | ORAL_TABLET | Freq: Every day | ORAL | Status: DC
  Administered 2017-01-30 – 2017-01-31 (×2): 1 via ORAL
  Filled 2017-01-29 (×2): qty 1

## 2017-01-29 MED ORDER — LIDOCAINE HCL (CARDIAC) 20 MG/ML IV SOLN
INTRAVENOUS | Status: DC | PRN
  Administered 2017-01-29: 60 mg via INTRAVENOUS
  Administered 2017-01-29: 40 mg via INTRAVENOUS

## 2017-01-29 MED ORDER — KETOROLAC TROMETHAMINE 30 MG/ML IJ SOLN
INTRAMUSCULAR | Status: AC
  Filled 2017-01-29: qty 1

## 2017-01-29 MED ORDER — METHOCARBAMOL 500 MG PO TABS: 500.0000 mg | ORAL_TABLET | Freq: Four times a day (QID) | ORAL | 0 refills | Status: DC | PRN

## 2017-01-29 MED ORDER — ENOXAPARIN SODIUM 40 MG/0.4ML ~~LOC~~ SOLN
40.0000 mg | SUBCUTANEOUS | Status: DC
  Administered 2017-01-30 – 2017-01-31 (×2): 40 mg via SUBCUTANEOUS
  Filled 2017-01-29 (×2): qty 0.4

## 2017-01-29 MED ORDER — THIAMINE HCL 100 MG/ML IJ SOLN: 100.0000 mg | Freq: Every day | INTRAMUSCULAR | Status: DC

## 2017-01-29 MED ORDER — CEFAZOLIN SODIUM-DEXTROSE 2-4 GM/100ML-% IV SOLN
2.0000 g | INTRAVENOUS | Status: AC
  Administered 2017-01-29: 2 g via INTRAVENOUS

## 2017-01-29 MED ORDER — HYDROCODONE-ACETAMINOPHEN 5-325 MG PO TABS: 1.0000 | ORAL_TABLET | ORAL | 0 refills | Status: DC | PRN

## 2017-01-29 MED ORDER — BISACODYL 10 MG RE SUPP: 10.0000 mg | Freq: Every day | RECTAL | Status: DC | PRN

## 2017-01-29 MED ORDER — MORPHINE SULFATE (PF) 4 MG/ML IV SOLN
2.0000 mg | INTRAVENOUS | Status: DC | PRN
  Administered 2017-01-29: 2 mg via INTRAVENOUS
  Filled 2017-01-29: qty 1

## 2017-01-29 MED ORDER — LORAZEPAM 2 MG/ML IJ SOLN
0.0000 mg | Freq: Four times a day (QID) | INTRAMUSCULAR | Status: AC
  Administered 2017-01-29 – 2017-01-30 (×2): 2 mg via INTRAVENOUS
  Administered 2017-01-30: 1 mg via INTRAVENOUS
  Filled 2017-01-29 (×3): qty 1

## 2017-01-29 MED ORDER — DOCUSATE SODIUM 100 MG PO CAPS
100.0000 mg | ORAL_CAPSULE | Freq: Two times a day (BID) | ORAL | Status: DC
Start: 2017-01-29 — End: 2017-01-31
  Administered 2017-01-29 – 2017-01-31 (×4): 100 mg via ORAL
  Filled 2017-01-29 (×4): qty 1

## 2017-01-29 MED ORDER — KETOROLAC TROMETHAMINE 30 MG/ML IJ SOLN
INTRAMUSCULAR | Status: DC | PRN
  Administered 2017-01-29: 30 mg via INTRAMUSCULAR

## 2017-01-29 MED ORDER — NICOTINE 21 MG/24HR TD PT24
21.0000 mg | MEDICATED_PATCH | Freq: Every day | TRANSDERMAL | Status: DC
  Administered 2017-01-29 – 2017-01-31 (×3): 21 mg via TRANSDERMAL
  Filled 2017-01-29 (×3): qty 1

## 2017-01-29 MED ORDER — BUPIVACAINE HCL (PF) 0.25 % IJ SOLN
INTRAMUSCULAR | Status: AC
  Filled 2017-01-29: qty 30

## 2017-01-29 MED ORDER — CEFAZOLIN SODIUM-DEXTROSE 2-4 GM/100ML-% IV SOLN
2.0000 g | Freq: Four times a day (QID) | INTRAVENOUS | Status: AC
  Administered 2017-01-29 – 2017-01-30 (×2): 2 g via INTRAVENOUS
  Filled 2017-01-29 (×2): qty 100

## 2017-01-29 MED ORDER — HYDROCODONE-ACETAMINOPHEN 5-325 MG PO TABS
1.0000 | ORAL_TABLET | ORAL | Status: DC | PRN
  Administered 2017-01-29 – 2017-01-31 (×8): 2 via ORAL
  Filled 2017-01-29 (×8): qty 2

## 2017-01-29 MED ORDER — ACETAMINOPHEN 650 MG RE SUPP: 650.0000 mg | Freq: Four times a day (QID) | RECTAL | Status: DC | PRN

## 2017-01-29 MED ORDER — ASPIRIN EC 325 MG PO TBEC: 325.0000 mg | DELAYED_RELEASE_TABLET | Freq: Every day | ORAL | 0 refills | Status: DC

## 2017-01-29 MED ORDER — SODIUM CHLORIDE 0.9 % IV BOLUS (SEPSIS)
1000.0000 mL | Freq: Once | INTRAVENOUS | Status: AC
  Administered 2017-01-29: 1000 mL via INTRAVENOUS

## 2017-01-29 MED ORDER — SUGAMMADEX SODIUM 200 MG/2ML IV SOLN
INTRAVENOUS | Status: DC | PRN
  Administered 2017-01-29: 126.2 mg via INTRAVENOUS

## 2017-01-29 MED ORDER — OXYCODONE HCL 5 MG/5ML PO SOLN: 5.0000 mg | Freq: Once | ORAL | Status: DC | PRN

## 2017-01-29 MED ORDER — FENTANYL CITRATE (PF) 100 MCG/2ML IJ SOLN
INTRAMUSCULAR | Status: AC
  Filled 2017-01-29: qty 2

## 2017-01-29 MED ORDER — FENTANYL CITRATE (PF) 100 MCG/2ML IJ SOLN
INTRAMUSCULAR | Status: DC | PRN
  Administered 2017-01-29: 100 ug via INTRAVENOUS
  Administered 2017-01-29: 50 ug via INTRAVENOUS

## 2017-01-29 MED ORDER — KETOROLAC TROMETHAMINE 30 MG/ML IJ SOLN
30.0000 mg | Freq: Once | INTRAMUSCULAR | Status: AC
  Administered 2017-01-29: 30 mg via INTRAVENOUS
  Filled 2017-01-29: qty 1

## 2017-01-29 MED ORDER — TRANEXAMIC ACID 1000 MG/10ML IV SOLN
1000.0000 mg | INTRAVENOUS | Status: AC
  Administered 2017-01-29: 1000 mg via INTRAVENOUS
  Filled 2017-01-29: qty 10

## 2017-01-29 MED ORDER — BUPIVACAINE HCL (PF) 0.25 % IJ SOLN
INTRAMUSCULAR | Status: DC | PRN
  Administered 2017-01-29: 30 mL

## 2017-01-29 MED ORDER — SENNA 8.6 MG PO TABS
1.0000 | ORAL_TABLET | Freq: Two times a day (BID) | ORAL | Status: DC
  Administered 2017-01-29 – 2017-01-31 (×4): 8.6 mg via ORAL
  Filled 2017-01-29 (×4): qty 1

## 2017-01-29 MED ORDER — METHOCARBAMOL 500 MG PO TABS
500.0000 mg | ORAL_TABLET | Freq: Four times a day (QID) | ORAL | Status: DC | PRN
  Administered 2017-01-30 – 2017-01-31 (×2): 500 mg via ORAL
  Filled 2017-01-29 (×2): qty 1

## 2017-01-29 MED ORDER — VITAMIN B-1 100 MG PO TABS
100.0000 mg | ORAL_TABLET | Freq: Every day | ORAL | Status: DC
  Administered 2017-01-30 – 2017-01-31 (×2): 100 mg via ORAL
  Filled 2017-01-29 (×2): qty 1

## 2017-01-29 MED ORDER — ROCURONIUM BROMIDE 100 MG/10ML IV SOLN
INTRAVENOUS | Status: DC | PRN
  Administered 2017-01-29: 50 mg via INTRAVENOUS
  Administered 2017-01-29: 20 mg via INTRAVENOUS

## 2017-01-29 MED ORDER — CEFAZOLIN SODIUM-DEXTROSE 2-4 GM/100ML-% IV SOLN
INTRAVENOUS | Status: AC
  Filled 2017-01-29: qty 100

## 2017-01-29 MED ORDER — FOLIC ACID 1 MG PO TABS
1.0000 mg | ORAL_TABLET | Freq: Every day | ORAL | Status: DC
  Administered 2017-01-30 – 2017-01-31 (×2): 1 mg via ORAL
  Filled 2017-01-29 (×2): qty 1

## 2017-01-29 MED ORDER — LORAZEPAM 1 MG PO TABS: 1.0000 mg | ORAL_TABLET | Freq: Four times a day (QID) | ORAL | Status: DC | PRN

## 2017-01-29 MED ORDER — ONDANSETRON HCL 4 MG PO TABS: 4.0000 mg | ORAL_TABLET | Freq: Four times a day (QID) | ORAL | Status: DC | PRN

## 2017-01-29 MED ORDER — MIDAZOLAM HCL 5 MG/5ML IJ SOLN
INTRAMUSCULAR | Status: DC | PRN
  Administered 2017-01-29: 1 mg via INTRAVENOUS

## 2017-01-29 MED ORDER — CHLORHEXIDINE GLUCONATE 4 % EX LIQD: 60.0000 mL | Freq: Once | CUTANEOUS | Status: DC

## 2017-01-29 MED ORDER — LORAZEPAM 2 MG/ML IJ SOLN: 0.0000 mg | Freq: Two times a day (BID) | INTRAMUSCULAR | Status: DC

## 2017-01-29 MED ORDER — PHENOL 1.4 % MT LIQD: 1.0000 | OROMUCOSAL | Status: DC | PRN

## 2017-01-29 MED ORDER — METOCLOPRAMIDE HCL 5 MG/ML IJ SOLN: 5.0000 mg | Freq: Three times a day (TID) | INTRAMUSCULAR | Status: DC | PRN

## 2017-01-29 MED ORDER — MIDAZOLAM HCL 2 MG/2ML IJ SOLN
INTRAMUSCULAR | Status: AC
  Filled 2017-01-29: qty 2

## 2017-01-29 MED ORDER — ONDANSETRON HCL 4 MG/2ML IJ SOLN
INTRAMUSCULAR | Status: DC | PRN
  Administered 2017-01-29: 4 mg via INTRAVENOUS

## 2017-01-29 MED ORDER — KETOROLAC TROMETHAMINE 30 MG/ML IJ SOLN
INTRAMUSCULAR | Status: DC | PRN
  Administered 2017-01-29: 30 mg via INTRAVENOUS

## 2017-01-29 MED ORDER — INSULIN ASPART 100 UNIT/ML ~~LOC~~ SOLN
0.0000 [IU] | Freq: Every day | SUBCUTANEOUS | Status: DC
  Administered 2017-01-29: 2 [IU] via SUBCUTANEOUS

## 2017-01-29 MED ORDER — 0.9 % SODIUM CHLORIDE (POUR BTL) OPTIME
TOPICAL | Status: DC | PRN
  Administered 2017-01-29: 1000 mL

## 2017-01-29 MED ORDER — SODIUM CHLORIDE 0.9 % IV SOLN
INTRAVENOUS | Status: DC
  Administered 2017-01-29 – 2017-01-30 (×3): via INTRAVENOUS

## 2017-01-29 SURGICAL SUPPLY — 46 items
BIT DRILL 7/64X5 DISP (BIT) ×1 IMPLANT
BLADE SAGITTAL 25.0X1.27X90 (BLADE) ×2 IMPLANT
BLADE SAGITTAL 25.0X1.27X90MM (BLADE) ×1
CAPT HIP HEMI 2 ×3 IMPLANT
CLOSURE STERI-STRIP 1/2X4 (GAUZE/BANDAGES/DRESSINGS) ×2
CLSR STERI-STRIP ANTIMIC 1/2X4 (GAUZE/BANDAGES/DRESSINGS) ×4 IMPLANT
COVER SURGICAL LIGHT HANDLE (MISCELLANEOUS) ×3 IMPLANT
DRAPE ORTHO SPLIT 77X108 STRL (DRAPES) ×6
DRAPE SURG ORHT 6 SPLT 77X108 (DRAPES) ×2 IMPLANT
DRAPE U-SHAPE 47X51 STRL (DRAPES) ×3 IMPLANT
DRSG AQUACEL AG ADV 3.5X 6 (GAUZE/BANDAGES/DRESSINGS) ×2 IMPLANT
DRSG MEPILEX BORDER 4X8 (GAUZE/BANDAGES/DRESSINGS) ×3 IMPLANT
DURAPREP 26ML APPLICATOR (WOUND CARE) ×3 IMPLANT
ELECT BLADE 4.0 EZ CLEAN MEGAD (MISCELLANEOUS) ×3
ELECT CAUTERY BLADE 6.4 (BLADE) ×3 IMPLANT
ELECT REM PT RETURN 9FT ADLT (ELECTROSURGICAL) ×3
ELECTRODE BLDE 4.0 EZ CLN MEGD (MISCELLANEOUS) ×1 IMPLANT
ELECTRODE REM PT RTRN 9FT ADLT (ELECTROSURGICAL) ×1 IMPLANT
FACESHIELD WRAPAROUND (MASK) ×3 IMPLANT
FACESHIELD WRAPAROUND OR TEAM (MASK) ×1 IMPLANT
GLOVE BIO SURGEON STRL SZ7.5 (GLOVE) ×6 IMPLANT
GLOVE BIOGEL PI IND STRL 8 (GLOVE) ×2 IMPLANT
GLOVE BIOGEL PI INDICATOR 8 (GLOVE) ×4
GOWN STRL REUS W/ TWL LRG LVL3 (GOWN DISPOSABLE) ×2 IMPLANT
GOWN STRL REUS W/ TWL XL LVL3 (GOWN DISPOSABLE) ×1 IMPLANT
GOWN STRL REUS W/TWL LRG LVL3 (GOWN DISPOSABLE) ×6
GOWN STRL REUS W/TWL XL LVL3 (GOWN DISPOSABLE) ×3
HIP CAPITATED HEMI 2 IMPLANT
KIT BASIN OR (CUSTOM PROCEDURE TRAY) ×3 IMPLANT
KIT ROOM TURNOVER OR (KITS) ×3 IMPLANT
MANIFOLD NEPTUNE II (INSTRUMENTS) ×3 IMPLANT
NS IRRIG 1000ML POUR BTL (IV SOLUTION) ×3 IMPLANT
PACK TOTAL JOINT (CUSTOM PROCEDURE TRAY) ×3 IMPLANT
PAD ARMBOARD 7.5X6 YLW CONV (MISCELLANEOUS) ×6 IMPLANT
PILLOW ABDUCTION HIP (SOFTGOODS) ×5 IMPLANT
RETRIEVER SUT HEWSON (MISCELLANEOUS) ×3 IMPLANT
SUT FIBERWIRE #2 38 REV NDL BL (SUTURE) ×6
SUT MNCRL AB 4-0 PS2 18 (SUTURE) ×3 IMPLANT
SUT MON AB 2-0 CT1 36 (SUTURE) ×3 IMPLANT
SUT VIC AB 1 CT1 27 (SUTURE) ×3
SUT VIC AB 1 CT1 27XBRD ANBCTR (SUTURE) ×1 IMPLANT
SUTURE FIBERWR#2 38 REV NDL BL (SUTURE) ×2 IMPLANT
TAPE STRIPS DRAPE STRL (GAUZE/BANDAGES/DRESSINGS) ×2 IMPLANT
TOWEL OR 17X24 6PK STRL BLUE (TOWEL DISPOSABLE) ×3 IMPLANT
TOWEL OR 17X26 10 PK STRL BLUE (TOWEL DISPOSABLE) ×3 IMPLANT
TRAY FOLEY CATH SILVER 14FR (SET/KITS/TRAYS/PACK) IMPLANT

## 2017-01-29 NOTE — ED Notes (Signed)
Bed: WA02 Expected date:  Expected time:  Means of arrival:  Comments: 73M Hip pain 100mcg fentanyl

## 2017-01-29 NOTE — Progress Notes (Signed)
PROGRESS NOTE  Troyce Hollar WUJ:[id number] DOB: 12/08/1965 DOA: 01/29/2017 PCP: No PCP Per Patient  Brief History:  51 year old male with a history of diabetes and hyperlipidemia presents to the hospital after waking up at 10:30 PM with right hip pain and inability to bear weight on his right lower extremity. The patient states that he drinks approximately 1 pint of whiskey daily. His last drink was around 7 PM on 01/28/2017. He states that he drank his usual amount whiskey with dinner and went to bed. He does not recall any recent falls or injuries. The patient denies any previous history of coronary artery disease/MI, stroke. He is able to perform his normal activities of daily living without any chest discomfort or shortness of breath. Orthopedics, Dr. Renaye Rakers was consulted and recommended transfer to [facility] for surgical repair.  Assessment/Plan: Right femoral neck fracture -appreciate Dr. Mauricio Po surgical repair -According the revised cardiac risk index, pt is low medical risk for surgery -no additional pre-op testing at this time -pain control -PT/OT after surgery -IVF in peri-operative period  Leukocytosis -Likely stress demargination -Patient is afebrile and hemodynamically stable  Diabetes mellitus type 2 -Holding metformin -NovoLog sliding scale  Alcohol abuse -Last drink at 7 PM 01/28/2017 -Alcohol withdrawal protocol  Tobacco abuse -Tobacco cessation discussed  Hyperlipidemia -Continue statin   Disposition Plan:   Home in 1-2 days  Family Communication:  No Family at bedside  Consultants:  Ortho--Dr. Renaye Rakers  Code Status:  FULL   DVT Prophylaxis:  SCSC Lovenox   Procedures: As Listed in Progress Note Above  Antibiotics: None    Subjective: Patient denies fevers, chills, headache, chest pain, dyspnea, nausea, vomiting, diarrhea, abdominal pain, dysuria, hematuria, hematochezia, and melena.   Objective: Vitals:   01/29/17 0500  01/29/17 0530 01/29/17 0630 01/29/17 0700  BP: 119/81 117/79 128/91 128/79  Pulse: 87 90 89 100  Resp: 17 16 16 21   Temp:      TempSrc:      SpO2: 98% 98% 100% 98%  Weight:      Height:       No intake or output data in the 24 hours ending 01/29/17 0717 Weight change:  Exam:   General:  Pt is alert, follows commands appropriately, not in acute distress  HEENT: No icterus, No thrush, No neck mass, [place]/AT  Cardiovascular: RRR, S1/S2, no rubs, no gallops  Respiratory: Diminished breath sounds at the bases. CTA bilaterally, no wheezing, no crackles, no rhonchi  Abdomen: Soft/+BS, non tender, non distended, no guarding  Extremities: No edema, No lymphangitis, No petechiae, No rashes, no synovitis   Data Reviewed: I have personally reviewed following labs and imaging studies Basic Metabolic Panel:  Recent Labs Lab 01/29/17 0143  NA 141  K 3.6  CL 102  GLUCOSE 159*  BUN 12  CREATININE 0.80   Liver Function Tests: No results for input(s): AST, ALT, ALKPHOS, BILITOT, PROT, ALBUMIN in the last 168 hours. No results for input(s): LIPASE, AMYLASE in the last 168 hours. No results for input(s): AMMONIA in the last 168 hours. Coagulation Profile: No results for input(s): INR, PROTIME in the last 168 hours. CBC:  Recent Labs Lab 01/29/17 0125 01/29/17 0143  WBC 13.8*  --   NEUTROABS 10.8*  --   HGB 14.3 14.3  HCT 40.2 42.0  MCV 89.9  --   PLT 206  --    Cardiac Enzymes: No results for input(s): CKTOTAL, CKMB, CKMBINDEX, TROPONINI  in the last 168 hours. BNP: Invalid input(s): POCBNP CBG: No results for input(s): GLUCAP in the last 168 hours. HbA1C: No results for input(s): HGBA1C in the last 72 hours. Urine analysis:    Component Value Date/Time   COLORURINE YELLOW 12/05/2016 0742   APPEARANCEUR CLEAR 12/05/2016 0742   LABSPEC <1.005 (L) 12/05/2016 0742   PHURINE 5.5 12/05/2016 0742   GLUCOSEU NEGATIVE 12/05/2016 0742   HGBUR NEGATIVE 12/05/2016 0742    BILIRUBINUR negative 12/19/2016 0950   KETONESUR negative 12/19/2016 0950   KETONESUR NEGATIVE 12/05/2016 0742   PROTEINUR negative 12/19/2016 0950   PROTEINUR NEGATIVE 12/05/2016 0742   UROBILINOGEN 0.2 12/19/2016 0950   NITRITE Negative 12/19/2016 0950   NITRITE NEGATIVE 12/05/2016 0742   LEUKOCYTESUR Trace (A) 12/19/2016 0950   Sepsis Labs: @LABRCNTIP (procalcitonin:4,lacticidven:4) )No results found for this or any previous visit (from the past 240 hour(s)).   Scheduled Meds: . enoxaparin (LOVENOX) injection  40 mg Subcutaneous Q24H  . folic acid  1 mg Oral Daily  . insulin aspart  0-5 Units Subcutaneous QHS  . insulin aspart  0-9 Units Subcutaneous TID WC  . LORazepam  0-4 mg Intravenous Q6H   Followed by  . [START ON 01/31/2017] LORazepam  0-4 mg Intravenous Q12H  . multivitamin with minerals  1 tablet Oral Daily  . thiamine  100 mg Oral Daily   Or  . thiamine  100 mg Intravenous Daily   Continuous Infusions: . sodium chloride 75 mL/hr at 01/29/17 0401    Procedures/Studies: Dg Chest 2 View  Result Date: 01/29/2017 CLINICAL DATA:  Preop. EXAM: CHEST  2 VIEW COMPARISON:  Chest radiographs 12/05/2016 FINDINGS: Lower lung volumes from prior exam. Normal heart size. The lungs are clear. Pulmonary vasculature is normal. No consolidation, pleural effusion, or pneumothorax. No acute osseous abnormalities are seen. IMPRESSION: Low lung volumes without acute abnormality. Electronically Signed   By: Rubye OaksMelanie  Ehinger M.D.   On: 01/29/2017 01:38   Dg Hip Unilat W Or Wo Pelvis 2-3 Views Right  Result Date: 01/29/2017 CLINICAL DATA:  Right hip pain EXAM: DG HIP (WITH OR WITHOUT PELVIS) 2-3V RIGHT COMPARISON:  None. FINDINGS: There is a superiorly displaced, predominantly transverse fracture of the right femoral neck. The femoral head remains situated within the acetabulum. The left hip is normal. No other pelvic fracture or diastasis. IMPRESSION: Predominantly transverse, mildly superiorly  displaced fracture of the right femoral neck. Electronically Signed   By: Deatra RobinsonKevin  Herman M.D.   On: 01/29/2017 01:41    Jaretssi Kraker, DO  [facility] Pager [phone]  If 7PM-7AM, please contact night-coverage [email or web address] Password [email or web address] 01/29/2017, 7:17 AM   LOS: 0 days

## 2017-01-29 NOTE — Anesthesia Preprocedure Evaluation (Signed)
Anesthesia Evaluation  Patient identified by MRN, date of birth, ID band Patient awake    Reviewed: Allergy & Precautions, H&P , NPO status , Patient's Chart, lab work & pertinent test results  Airway Mallampati: II   Neck ROM: full    Dental   Pulmonary Current Smoker,    breath sounds clear to auscultation       Cardiovascular negative cardio ROS   Rhythm:regular Rate:Normal     Neuro/Psych H/o polio    GI/Hepatic   Endo/Other  diabetes, Type 2  Renal/GU      Musculoskeletal   Abdominal   Peds  Hematology   Anesthesia Other Findings   Reproductive/Obstetrics                             Anesthesia Physical Anesthesia Plan  ASA: II  Anesthesia Plan: General   Post-op Pain Management:    Induction: Intravenous  Airway Management Planned: Oral ETT  Additional Equipment:   Intra-op Plan:   Post-operative Plan: Extubation in OR  Informed Consent: I have reviewed the patients History and Physical, chart, labs and discussed the procedure including the risks, benefits and alternatives for the proposed anesthesia with the patient or authorized representative who has indicated his/her understanding and acceptance.     Plan Discussed with: CRNA, Anesthesiologist and Surgeon  Anesthesia Plan Comments:         Anesthesia Quick Evaluation

## 2017-01-29 NOTE — Anesthesia Postprocedure Evaluation (Addendum)
Anesthesia Post Note  Patient: Kenneth Roth  Procedure(s) Performed: Procedure(s) (LRB): ARTHROPLASTY BIPOLAR HIP (HEMIARTHROPLASTY) (Right)  Patient location during evaluation: PACU Anesthesia Type: General Level of consciousness: awake and alert and patient cooperative Pain management: pain level controlled Vital Signs Assessment: post-procedure vital signs reviewed and stable Respiratory status: spontaneous breathing and respiratory function stable Cardiovascular status: stable Anesthetic complications: no       Last Vitals:  Vitals:   01/29/17 1614 01/29/17 1641  BP: 128/86 138/89  Pulse: 88 91  Resp: (!) 22 20  Temp:  36.9 C    Last Pain:  Vitals:   01/29/17 1641  TempSrc: Oral  PainSc: 5                  Lakresha Stifter S

## 2017-01-29 NOTE — H&P (Signed)
History and Physical    Mansfield Fruin GNF:[id number] DOB: 12-Aug-1966 DOA: 01/29/2017  Referring MD/NP/PA: Dr. Nicanor Alcon PCP: No PCP Per Patient  Patient coming from: home  Chief Complaint: Right hip pain  HPI: Kenneth Roth is a 51 y.o. male with medical history significant of DM type 2, HLD, post polio muscle weakness, and alcohol abuse; who presents with complaints of right hip pain. Patient reports that he woke up at around 10:30 PM last night and was unable to get up or bear weight on his right leg due to pain. Denies any knowledge of any recent fall or trauma. Admits to being drunk currently and states that he drinks approximately a pint of whiskey daily on average. Associated symptoms include weight loss of unknown amount. Denies any significant history of going into alcohol withdrawals. Patient does not complain of chest pain, shortness breath, fever, chills, nausea, vomiting, diarrhea.   ED Course: Vital signs stable while in the ED, but the patient noted to be acutely intoxicated. Lab work revealed WBC 13.8. X-ray imaging showed a closed displaced right femoral neck. Dr. Eulah Pont of orthopedics consulted and recommended transfer to [facility] for likely need of surgical repair in a.m.  Review of Systems: As per HPI otherwise 10 point review of systems negative.   Past Medical History:  Diagnosis Date  . Diabetes mellitus without complication (HCC)   . Polio     Past Surgical History:  Procedure Laterality Date  . HERNIA REPAIR       reports that he has been smoking.  He uses smokeless tobacco. He reports that he drinks alcohol. He reports that he does not use drugs.  No Known Allergies  Family History  Problem Relation Age of Onset  . Diabetes Mother   . Diabetes Sister   . Diabetes Brother   . Diabetes Sister     Prior to Admission medications   Medication Sig Start Date End Date Taking? Authorizing Provider  atorvastatin (LIPITOR) 40 MG tablet Take 0.5 tablets (20 mg total)  by mouth daily. Patient taking differently: Take 40 mg by mouth daily.  08/22/16  Yes Peyton Najjar, MD  metFORMIN (GLUCOPHAGE-XR) 500 MG 24 hr tablet Take 2 in the morning and 1 in the evening for diabetes 08/29/16  Yes Peyton Najjar, MD  cyclobenzaprine (FLEXERIL) 10 MG tablet Take 1 tablet (10 mg total) by mouth 2 (two) times daily as needed for muscle spasms. Patient not taking: Reported on 01/29/2017 12/05/16   Courteney Lyn Mackuen, MD  gabapentin (NEURONTIN) 300 MG capsule Take 1 capsule (300 mg total) by mouth 3 (three) times daily. Patient not taking: Reported on 01/29/2017 12/19/16   Doristine Bosworth, MD  hydrocortisone 2.5 % cream Apply topically 2 (two) times daily. 12/19/16   Doristine Bosworth, MD  ibuprofen (ADVIL,MOTRIN) 800 MG tablet Take 1 tablet (800 mg total) by mouth 3 (three) times daily. Patient not taking: Reported on 01/29/2017 12/05/16   Courteney Lyn Mackuen, MD    Physical Exam:   Constitutional: Disheveled in cachectic male who began mild discomfort with any movement. Vitals:   01/29/17 0034  BP: 134/86  Pulse: 90  Resp: 20  Temp: 97.7 F (36.5 C)  TempSrc: Oral  SpO2: 98%  Weight: 63 kg (139 lb)  Height: 5\' 7"  (1.702 m)   Eyes: PERRL, lids and conjunctivae normal ENMT: Mucous membranes are dry. Posterior pharynx clear of any exudate or lesions. Very poor dentition. Neck: normal, supple, no masses, no thyromegaly Respiratory:  clear to auscultation bilaterally, no wheezing, no crackles. Normal respiratory effort. No accessory muscle use.  Cardiovascular: Regular rate and rhythm, no murmurs / rubs / gallops. No extremity edema. 2+ pedal pulses. No carotid bruits.  Abdomen: no tenderness, no masses palpated. No hepatosplenomegaly. Bowel sounds positive.  Musculoskeletal: no clubbing / cyanosis. Severely shortened right lower extremity that is externally rotated. With significant tenderness to palpation. Muscle wasting of extremities noted. Skin: no rashes, lesions, ulcers.  No induration Neurologic: CN 2-12 grossly intact. Sensation intact, DTR normal. Strength 5/5 in all 4.  Psychiatric: Poor judgment and insight. Alert and oriented x 3. Normal mood.     Labs on Admission: I have personally reviewed following labs and imaging studies  CBC:  Recent Labs Lab 01/29/17 0125 01/29/17 0143  WBC 13.8*  --   NEUTROABS 10.8*  --   HGB 14.3 14.3  HCT 40.2 42.0  MCV 89.9  --   PLT 206  --    Basic Metabolic Panel:  Recent Labs Lab 01/29/17 0143  NA 141  K 3.6  CL 102  GLUCOSE 159*  BUN 12  CREATININE 0.80   GFR: Estimated Creatinine Clearance: 99.7 mL/min (by C-G formula based on SCr of 0.8 mg/dL). Liver Function Tests: No results for input(s): AST, ALT, ALKPHOS, BILITOT, PROT, ALBUMIN in the last 168 hours. No results for input(s): LIPASE, AMYLASE in the last 168 hours. No results for input(s): AMMONIA in the last 168 hours. Coagulation Profile: No results for input(s): INR, PROTIME in the last 168 hours. Cardiac Enzymes: No results for input(s): CKTOTAL, CKMB, CKMBINDEX, TROPONINI in the last 168 hours. BNP (last 3 results) No results for input(s): PROBNP in the last 8760 hours. HbA1C: No results for input(s): HGBA1C in the last 72 hours. CBG: No results for input(s): GLUCAP in the last 168 hours. Lipid Profile: No results for input(s): CHOL, HDL, LDLCALC, TRIG, CHOLHDL, LDLDIRECT in the last 72 hours. Thyroid Function Tests: No results for input(s): TSH, T4TOTAL, FREET4, T3FREE, THYROIDAB in the last 72 hours. Anemia Panel: No results for input(s): VITAMINB12, FOLATE, FERRITIN, TIBC, IRON, RETICCTPCT in the last 72 hours. Urine analysis:    Component Value Date/Time   COLORURINE YELLOW 12/05/2016 0742   APPEARANCEUR CLEAR 12/05/2016 0742   LABSPEC <1.005 (L) 12/05/2016 0742   PHURINE 5.5 12/05/2016 0742   GLUCOSEU NEGATIVE 12/05/2016 0742   HGBUR NEGATIVE 12/05/2016 0742   BILIRUBINUR negative 12/19/2016 0950   KETONESUR  negative 12/19/2016 0950   KETONESUR NEGATIVE 12/05/2016 0742   PROTEINUR negative 12/19/2016 0950   PROTEINUR NEGATIVE 12/05/2016 0742   UROBILINOGEN 0.2 12/19/2016 0950   NITRITE Negative 12/19/2016 0950   NITRITE NEGATIVE 12/05/2016 0742   LEUKOCYTESUR Trace (A) 12/19/2016 0950   Sepsis Labs: No results found for this or any previous visit (from the past 240 hour(s)).   Radiological Exams on Admission: Dg Chest 2 View  Result Date: 01/29/2017 CLINICAL DATA:  Preop. EXAM: CHEST  2 VIEW COMPARISON:  Chest radiographs 12/05/2016 FINDINGS: Lower lung volumes from prior exam. Normal heart size. The lungs are clear. Pulmonary vasculature is normal. No consolidation, pleural effusion, or pneumothorax. No acute osseous abnormalities are seen. IMPRESSION: Low lung volumes without acute abnormality. Electronically Signed   By: Rubye OaksMelanie  Ehinger M.D.   On: 01/29/2017 01:38   Dg Hip Unilat W Or Wo Pelvis 2-3 Views Right  Result Date: 01/29/2017 CLINICAL DATA:  Right hip pain EXAM: DG HIP (WITH OR WITHOUT PELVIS) 2-3V RIGHT COMPARISON:  None. FINDINGS: There  is a superiorly displaced, predominantly transverse fracture of the right femoral neck. The femoral head remains situated within the acetabulum. The left hip is normal. No other pelvic fracture or diastasis. IMPRESSION: Predominantly transverse, mildly superiorly displaced fracture of the right femoral neck. Electronically Signed   By: Deatra Robinson M.D.   On: 01/29/2017 01:41    EKG: ordered  Assessment/Plan Right femoral neck fracture: Acute. Patient denies knowledge of how injury occurred. X-rays reveal closed and displaced right femoral neck fracture. Dr. Eulah Pont of orthopedics consulted and will see patient in a.m. - Admit to MedSurg bed at [facility] - hip fracture protocol initiated - NPO - IVF NS at 75 ml/hr - Pain control - Will need to start patient on bowel regimen when able - Appreciate orthopedic consultative services, follow-up  recommendations in a.m.  Leukocytosis: WBC elevated at 13.8 on admission. Suspect this is acute reaction secondary to injury. Chest x-ray was of poor quality, but otherwise noted to be clear. Patient denies any significant urinary symptoms. - Recheck CBC   Diabetes mellitus type 2: Patient reports being currently controlled with oral medications of metformin. - Hypoglycemic protocol - Hold metformin - CBGs with sensitive SSI   Alcohol abuse with acute intoxication - CWIA protocol with scheduled Ativan  Hyperlipidemia - Restart atorvastatin when able   Weight loss: Patient reportsgradual progressive weight loss. Question if secondary to uncontrolled diabetes versus alcohol abuse versus other. - check TSH  History of postpolio muscle weakness   DVT prophylaxis: lovenox  Code Status: Full Family Communication: Discussed plan of care with the patient and family present at bedside. Disposition Plan: Will likely need rehabilitation Consults called: Orthopedic surgery  Admission status:Inpatient  Clydie Braun MD [facility] Pager [phone]  If 7PM-7AM, please contact night-coverage [email or web address] Password [email or web address]  01/29/2017, 2:14 AM

## 2017-01-29 NOTE — ED Notes (Signed)
[facility] picking up patient. 

## 2017-01-29 NOTE — Progress Notes (Signed)
Patient's belonging bag (1) transferred from room 39 to PACU, and placed in their storage cabinet for belongings.

## 2017-01-29 NOTE — Anesthesia Procedure Notes (Signed)
Procedure Name: Intubation Date/Time: 01/29/2017 1:50 PM Performed by: Virgel GessHOLTZMAN, Virginie Josten LEFFEW Pre-anesthesia Checklist: Patient identified, Patient being monitored, Timeout performed, Emergency Drugs available and Suction available Patient Re-evaluated:Patient Re-evaluated prior to inductionOxygen Delivery Method: Circle System Utilized Preoxygenation: Pre-oxygenation with 100% oxygen Intubation Type: IV induction Ventilation: Mask ventilation without difficulty Laryngoscope Size: Glidescope and 4 Grade View: Grade I Tube type: Oral Tube size: 8.0 mm Number of attempts: 1 Airway Equipment and Method: Video-laryngoscopy and Rigid stylet Placement Confirmation: ETT inserted through vocal cords under direct vision,  positive ETCO2 and breath sounds checked- equal and bilateral Secured at: 22 cm Tube secured with: Tape Dental Injury: Teeth and Oropharynx as per pre-operative assessment

## 2017-01-29 NOTE — Transfer of Care (Signed)
Immediate Anesthesia Transfer of Care Note  Patient: Kenneth Roth  Procedure(s) Performed: Procedure(s): ARTHROPLASTY BIPOLAR HIP (HEMIARTHROPLASTY) (Right)  Patient Location: PACU  Anesthesia Type:General  Level of Consciousness: awake, alert , oriented, patient cooperative and responds to stimulation  Airway & Oxygen Therapy: Patient Spontanous Breathing and Patient connected to nasal cannula oxygen  Post-op Assessment: Report given to RN, Post -op Vital signs reviewed and stable and Patient moving all extremities X 4  Post vital signs: Reviewed and stable  Last Vitals:  Vitals:   01/29/17 0730 01/29/17 0800  BP: 134/90 140/81  Pulse: 96   Resp: 15 18  Temp:      Last Pain:  Vitals:   01/29/17 0508  TempSrc:   PainSc: Asleep         Complications: No apparent anesthesia complications

## 2017-01-29 NOTE — Consult Note (Signed)
ORTHOPAEDIC CONSULTATION  REQUESTING PHYSICIAN: Orson Eva, MD  Chief Complaint: Right hip fracture  HPI: Kenneth Roth is a 51 y.o. male who has a history of polio and significant alcohol abuse and woke up with inability to bear weight on his right hip. He reports that he normally ambulates with this leg with no assistive device. He has pain at the groin  Past Medical History:  Diagnosis Date  . Diabetes mellitus without complication ([place])   . Polio    Past Surgical History:  Procedure Laterality Date  . HERNIA REPAIR     Social History   Social History  . Marital status: Married    Spouse name: N/A  . Number of children: N/A  . Years of education: N/A   Social History Main Topics  . Smoking status: Current Every Day Smoker  . Smokeless tobacco: Current User  . Alcohol use Yes     Comment: 1 beer every day  . Drug use: No  . Sexual activity: Not Asked   Other Topics Concern  . None   Social History Narrative  . None   Family History  Problem Relation Age of Onset  . Diabetes Mother   . Diabetes Sister   . Diabetes Brother   . Diabetes Sister    No Known Allergies Prior to Admission medications   Medication Sig Start Date End Date Taking? Authorizing Provider  atorvastatin (LIPITOR) 40 MG tablet Take 0.5 tablets (20 mg total) by mouth daily. Patient taking differently: Take 40 mg by mouth daily.  08/22/16  Yes Posey Boyer, MD  metFORMIN (GLUCOPHAGE-XR) 500 MG 24 hr tablet Take 2 in the morning and 1 in the evening for diabetes 08/29/16  Yes Posey Boyer, MD  cyclobenzaprine (FLEXERIL) 10 MG tablet Take 1 tablet (10 mg total) by mouth 2 (two) times daily as needed for muscle spasms. Patient not taking: Reported on 01/29/2017 12/05/16   Courteney Lyn Mackuen, MD  gabapentin (NEURONTIN) 300 MG capsule Take 1 capsule (300 mg total) by mouth 3 (three) times daily. Patient not taking: Reported on 01/29/2017 12/19/16   Forrest Moron, MD  hydrocortisone 2.5 % cream  Apply topically 2 (two) times daily. 12/19/16   Forrest Moron, MD  ibuprofen (ADVIL,MOTRIN) 800 MG tablet Take 1 tablet (800 mg total) by mouth 3 (three) times daily. Patient not taking: Reported on 01/29/2017 12/05/16   Courteney Lyn Mackuen, MD   Dg Chest 2 View  Result Date: 01/29/2017 CLINICAL DATA:  Preop. EXAM: CHEST  2 VIEW COMPARISON:  Chest radiographs 12/05/2016 FINDINGS: Lower lung volumes from prior exam. Normal heart size. The lungs are clear. Pulmonary vasculature is normal. No consolidation, pleural effusion, or pneumothorax. No acute osseous abnormalities are seen. IMPRESSION: Low lung volumes without acute abnormality. Electronically Signed   By: Jeb Levering M.D.   On: 01/29/2017 01:38   Dg Hip Unilat W Or Wo Pelvis 2-3 Views Right  Result Date: 01/29/2017 CLINICAL DATA:  Right hip pain EXAM: DG HIP (WITH OR WITHOUT PELVIS) 2-3V RIGHT COMPARISON:  None. FINDINGS: There is a superiorly displaced, predominantly transverse fracture of the right femoral neck. The femoral head remains situated within the acetabulum. The left hip is normal. No other pelvic fracture or diastasis. IMPRESSION: Predominantly transverse, mildly superiorly displaced fracture of the right femoral neck. Electronically Signed   By: Ulyses Jarred M.D.   On: 01/29/2017 01:41    Positive ROS: All other systems have been reviewed and were  otherwise negative with the exception of those mentioned in the HPI and as above.  Labs cbc  Recent Labs  01/29/17 0125 01/29/17 0143  WBC 13.8*  --   HGB 14.3 14.3  HCT 40.2 42.0  PLT 206  --     Labs inflam No results for input(s): CRP in the last 72 hours.  Invalid input(s): ESR  Labs coag No results for input(s): INR, PTT in the last 72 hours.  Invalid input(s): PT   Recent Labs  01/29/17 0143  NA 141  K 3.6  CL 102  GLUCOSE 159*  BUN 12  CREATININE 0.80    Physical Exam: Vitals:   01/29/17 0730 01/29/17 0800  BP: 134/90 140/81  Pulse: 96     Resp: 15 18  Temp:     General: Alert, no acute distress Cardiovascular: No pedal edema Respiratory: No cyanosis, no use of accessory musculature GI: No organomegaly, abdomen is soft and non-tender Skin: No lesions in the area of chief complaint other than those listed below in MSK exam.  Neurologic: Sensation intact distally save for the below mentioned MSK exam Psychiatric: Patient is competent for consent with normal mood and affect Lymphatic: No axillary or cervical lymphadenopathy  MUSCULOSKELETAL:  Right lower extremity has decreased muscle tone he is able to plantarflex and dorsiflex his ankle decreased sensation. Her compartments are soft skin is benign Other extremities are atraumatic with painless ROM and NVI.  Assessment: R fem neck fracture  Plan: Right hip hemiarthroplasty today I discussed with him the risks associated with this including infection and instability. I recommend he work on alcohol cessation as it does put him at increased risk of having instability after the surgery. In  light of this he would like to go forward with surgery    Renette Butters, MD Cell [phone]   01/29/2017 10:44 AM

## 2017-01-29 NOTE — ED Triage Notes (Signed)
Per EMS pt woke up an hour ago with severe right hip pain. Pt was unable to bear any weight on that hip. Pt denies any falls or injuries. Pt has Hx of polio and diabetes. Pt reports he is a heavy drinker and daily smoker. Pt reports having 1 pint of whiskey tonight and states that's "normal" for him. Fentanyl prior to arrival via EMS.   122/76 p 96 sinus rhythm  resp 18  lungs clear

## 2017-01-29 NOTE — Op Note (Signed)
01/29/2017  2:47 PM  PATIENT:  Kenneth Roth   MRN: [id number]  PRE-OPERATIVE DIAGNOSIS:  Right Femoral Neck Fracture  POST-OPERATIVE DIAGNOSIS:  Right Femoral Neck Fracture  PROCEDURE:  Procedure(s): ARTHROPLASTY BIPOLAR HIP (HEMIARTHROPLASTY)  PREOPERATIVE INDICATIONS:  Kenneth Roth is an 51 y.o. male who was admitted 01/29/2017 with a diagnosis of Closed displaced fracture of right femoral neck ([place]) and elected for surgical management.  The risks benefits and alternatives were discussed with the patient including but not limited to the risks of nonoperative treatment, versus surgical intervention including infection, bleeding, nerve injury, periprosthetic fracture, the need for revision surgery, dislocation, leg length discrepancy, blood clots, cardiopulmonary complications, morbidity, mortality, among others, and they were willing to proceed.  Predicted outcome is good, although there will be at least a six to nine month expected recovery.   OPERATIVE REPORT     SURGEON:  Edmonia Lynch, MD    ASSISTANT:  Roxan Hockey, PA-C, he was present and scrubbed throughout the case, critical for completion in a timely fashion, and for retraction, instrumentation, and closure.     ANESTHESIA:  General    COMPLICATIONS:  None.      COMPONENTS:  [employer] Acolade: Femoral stem: 1, Femoral Head:52, Neck:0   PROCEDURE IN DETAIL: The patient was met in the holding area and identified.  The appropriate hip  was marked at the operative site. The patient was then transported to the OR and  placed under general anesthesia.  At that point, the patient was  placed in the lateral decubitus position with the operative side up and  secured to the operating room table and all bony prominences padded.     The operative lower extremity was prepped from the iliac crest to the toes.  Sterile draping was performed.  Time out was performed prior to incision.      A routine posterolateral approach was utilized via  sharp dissection  carried down to the subcutaneous tissue.  Gross bleeders were Bovie  coagulated.  The iliotibial band was identified and incised  along the length of the skin incision.  Self-retaining retractors were  inserted. I examined the bursa there was significant hematoma and edema I performed a bursectomy here.  With the hip internally rotated, the short external rotators  were identified. The piriformis was tagged with FiberWire, and the hip capsule released in a T-type fashion.  The femoral neck was exposed, and I resected the femoral neck using the appropriate jig. This was performed at approximately a thumb's breadth above the lesser trochanter.    I then exposed the deep acetabulum, cleared out any tissue including the ligamentum teres, and included the hip capsule in the FiberWire used above and below the T.    I then prepared the proximal femur using the cookie-cutter, the lateralizing reamer, and then sequentially broached.  A trial utilized, and I reduced the hip and it was found to have excellent stability with functional range of motion. The trial components were then removed.   The canal and acetabulum were thoroughly irrigated  I inserted the pressfit stem and placed the head and neck collar. The hip was reduced with appropriate force and was stable through a range of motion.   I then used a 2 mm drill bits to pass the FiberWire suture from the capsule and puriform is through the greater trochanter, and secured this. Excellent posterior capsular repair was achieved. I also closed the T in the capsule.  I then irrigated the hip copiously again  with pulse lavage, and repaired the fascia with Vicryl, followed by Vicryl for the subcutaneous tissue, Monocryl for the skin, Steri-Strips and sterile gauze. The wounds were injected. The patient was then awakened and returned to PACU in stable and satisfactory condition. There were no complications.  POST-OP PLAN: Weight bearing as  tolerated. DVT px will consist of SCD's and chemical px  Edmonia Lynch, MD Orthopedic Surgeon [phone]   01/29/2017 2:47 PM

## 2017-01-29 NOTE — ED Provider Notes (Signed)
[facility]-EMERGENCY DEPT Provider Note   CSN: [id number] Arrival date & time: 01/29/17  0022   By signing my name below, I, Soijett Blue, attest that this documentation has been prepared under the direction and in the presence of Perle Gibbon, MD. Electronically Signed: Soijett Blue, ED Scribe. 01/29/17. 1:47 AM.  History   Chief Complaint Chief Complaint  Patient presents with  . Hip Pain    HPI Kenneth Roth is a 51 y.o. male with a PMHx of poliomyelitis, DM, who presents to the Emergency Department brought in by EMS, complaining of right hip pain onset tonight PTA. Pt has not tried any medications for the relief of his symptoms. Pt notes that he drank 0.5 bottle of whiskey tonight PTA. Friends state the pt was by himself and he called them to inform that he couldn't feel his right leg. Pt notes that he woke up from his nap with right hip pain. He states that his right leg is chronically rotated due to hx of polio. He reports that his last drink was at 7 PM last night. He denies any other symptoms. Denies recent fall.    The history is provided by the patient and the EMS personnel. No language interpreter was used.  Hip Pain  This is a new problem. The current episode started 1 to 2 hours ago. The problem occurs constantly. The problem has not changed since onset.Pertinent negatives include no chest pain and no shortness of breath. Nothing aggravates the symptoms. Nothing relieves the symptoms. He has tried nothing for the symptoms. The treatment provided no relief.    Past Medical History:  Diagnosis Date  . Diabetes mellitus without complication [facility])     Patient Active Problem List   Diagnosis Date Noted  . History of poliomyelitis 08/22/2016  . Hyperlipidemia 08/22/2016  . Type 2 diabetes mellitus without complication, without long-term current use of insulin ([facility]) 08/22/2016  . HERNIA 09/04/2007    Past Surgical History:  Procedure Laterality Date  . HERNIA REPAIR          Home Medications    Prior to Admission medications   Medication Sig Start Date End Date Taking? Authorizing Provider  atorvastatin (LIPITOR) 40 MG tablet Take 0.5 tablets (20 mg total) by mouth daily. 08/22/16   Peyton Najjar, MD  cyclobenzaprine (FLEXERIL) 10 MG tablet Take 1 tablet (10 mg total) by mouth 2 (two) times daily as needed for muscle spasms. 12/05/16   Courteney Lyn Mackuen, MD  gabapentin (NEURONTIN) 300 MG capsule Take 1 capsule (300 mg total) by mouth 3 (three) times daily. 12/19/16   Doristine Bosworth, MD  hydrocortisone 2.5 % cream Apply topically 2 (two) times daily. 12/19/16   Doristine Bosworth, MD  ibuprofen (ADVIL,MOTRIN) 800 MG tablet Take 1 tablet (800 mg total) by mouth 3 (three) times daily. 12/05/16   Courteney Lyn Mackuen, MD  metFORMIN (GLUCOPHAGE-XR) 500 MG 24 hr tablet Take 2 in the morning and 1 in the evening for diabetes 08/29/16   Peyton Najjar, MD    Family History Family History  Problem Relation Age of Onset  . Diabetes Mother   . Diabetes Sister   . Diabetes Brother   . Diabetes Sister     Social History Social History  Substance Use Topics  . Smoking status: Current Every Day Smoker  . Smokeless tobacco: Current User  . Alcohol use Yes     Comment: 1 beer every day     Allergies   Patient  has no known allergies.   Review of Systems Review of Systems  Constitutional: Negative for chills and fever.  HENT: Negative for drooling and facial swelling.   Eyes: Negative for photophobia.  Respiratory: Negative for shortness of breath.   Cardiovascular: Negative for chest pain, palpitations and leg swelling.  Gastrointestinal: Negative for anal bleeding.  Genitourinary: Negative for difficulty urinating.  Musculoskeletal: Positive for arthralgias (right hip). Negative for back pain.  Neurological: Negative for facial asymmetry and speech difficulty.  Psychiatric/Behavioral: Negative for suicidal ideas.  All other systems reviewed and are  negative.    Physical Exam Updated Vital Signs BP 134/86 (BP Location: Left Arm)   Pulse 90   Temp 97.7 F (36.5 C) (Oral)   Resp 20   Ht 5\' 7"  (1.702 m)   Wt 139 lb (63 kg)   SpO2 98%   BMI 21.77 kg/m   Physical Exam  Constitutional: He is oriented to person, place, and time. He appears well-developed and well-nourished. No distress.  HENT:  Head: Normocephalic and atraumatic.  Mouth/Throat: Oropharynx is clear and moist. No oropharyngeal exudate.  Eyes: Conjunctivae and EOM are normal. Pupils are equal, round, and reactive to light.  Neck: Trachea normal and normal range of motion. Neck supple. No JVD present. Carotid bruit is not present. No tracheal deviation present.  Trachea midline. No stridor. No bruits.   Cardiovascular: Normal rate, regular rhythm and normal heart sounds.  Exam reveals no gallop and no friction rub.   No murmur heard. Pulmonary/Chest: Effort normal and breath sounds normal. No stridor. No respiratory distress. He has no wheezes. He has no rales.  Abdominal: Soft. Bowel sounds are normal. He exhibits no distension and no mass. There is no tenderness. There is no rebound and no guarding.  Musculoskeletal: He exhibits deformity.  Right hip is chronically externally rotated. Intact achilles. Great pulses bilaterally. Good cap refill bilaterally. Soft compartments. No palpable cords.  Neurological: He is alert and oriented to person, place, and time. He has normal reflexes.  DTR's intact.  Skin: Skin is warm and dry. Capillary refill takes less than 2 seconds. He is not diaphoretic.  Psychiatric: He has a normal mood and affect. His behavior is normal.  Nursing note and vitals reviewed.    ED Treatments / Results   Vitals:   01/29/17 0034  BP: 134/86  Pulse: 90  Resp: 20  Temp: 97.7 F (36.5 C)   Results for orders placed or performed during the hospital encounter of 01/29/17  CBC with Differential/Platelet  Result Value Ref Range   WBC 13.8  (H) 4.0 - 10.5 K/uL   RBC 4.47 4.22 - 5.81 MIL/uL   Hemoglobin 14.3 13.0 - 17.0 g/dL   HCT 16.140.2 09.639.0 - 04.552.0 %   MCV 89.9 78.0 - 100.0 fL   MCH 32.0 26.0 - 34.0 pg   MCHC 35.6 30.0 - 36.0 g/dL   RDW 40.912.9 81.111.5 - 91.415.5 %   Platelets 206 150 - 400 K/uL   Neutrophils Relative % 78 %   Neutro Abs 10.8 (H) 1.7 - 7.7 K/uL   Lymphocytes Relative 14 %   Lymphs Abs 1.9 0.7 - 4.0 K/uL   Monocytes Relative 7 %   Monocytes Absolute 0.9 0.1 - 1.0 K/uL   Eosinophils Relative 1 %   Eosinophils Absolute 0.1 0.0 - 0.7 K/uL   Basophils Relative 0 %   Basophils Absolute 0.0 0.0 - 0.1 K/uL  I-Stat Chem 8, ED  Result Value Ref Range  Sodium 141 135 - 145 mmol/L   Potassium 3.6 3.5 - 5.1 mmol/L   Chloride 102 101 - 111 mmol/L   BUN 12 6 - 20 mg/dL   Creatinine, Ser 1.61 0.61 - 1.24 mg/dL   Glucose, Bld 096 (H) 65 - 99 mg/dL   Calcium, Ion 0.45 (L) 1.15 - 1.40 mmol/L   TCO2 23 0 - 100 mmol/L   Hemoglobin 14.3 13.0 - 17.0 g/dL   HCT 40.9 81.1 - 91.4 %   Dg Chest 2 View  Result Date: 01/29/2017 CLINICAL DATA:  Preop. EXAM: CHEST  2 VIEW COMPARISON:  Chest radiographs 12/05/2016 FINDINGS: Lower lung volumes from prior exam. Normal heart size. The lungs are clear. Pulmonary vasculature is normal. No consolidation, pleural effusion, or pneumothorax. No acute osseous abnormalities are seen. IMPRESSION: Low lung volumes without acute abnormality. Electronically Signed   By: Rubye Oaks M.D.   On: 01/29/2017 01:38   Dg Hip Unilat W Or Wo Pelvis 2-3 Views Right  Result Date: 01/29/2017 CLINICAL DATA:  Right hip pain EXAM: DG HIP (WITH OR WITHOUT PELVIS) 2-3V RIGHT COMPARISON:  None. FINDINGS: There is a superiorly displaced, predominantly transverse fracture of the right femoral neck. The femoral head remains situated within the acetabulum. The left hip is normal. No other pelvic fracture or diastasis. IMPRESSION: Predominantly transverse, mildly superiorly displaced fracture of the right femoral neck.  Electronically Signed   By: Deatra Robinson M.D.   On: 01/29/2017 01:41    Date: 01/29/2017  Rate: 79  Rhythm: normal sinus rhythm  QRS Axis: normal  Intervals: normal  ST/T Wave abnormalities: normal  Conduction Disutrbances: none  Narrative Interpretation: unremarkable  DIAGNOSTIC STUDIES: Oxygen Saturation is 98% on RA, nl by my interpretation.    COORDINATION OF CARE: 1:41 AM Discussed treatment plan with pt at bedside which includes hip unilateral right xray, CXR, labs, consult with hospitalist, and pt agreed to plan.  1:33 AM-Consult with Orthopedist, Dr. Renaye Rakers who recommends admission to [facility] and keeping the pt NPO.   2:05 AM- Consult with Dr. Madelyn Flavors who accepts that admission.  2:20 AM- Consult with Dr. Madelyn Flavors, who recommends admission to accepting provider Dr. Antionette Char.    Radiology Dg Chest 2 View  Result Date: 01/29/2017 CLINICAL DATA:  Preop. EXAM: CHEST  2 VIEW COMPARISON:  Chest radiographs 12/05/2016 FINDINGS: Lower lung volumes from prior exam. Normal heart size. The lungs are clear. Pulmonary vasculature is normal. No consolidation, pleural effusion, or pneumothorax. No acute osseous abnormalities are seen. IMPRESSION: Low lung volumes without acute abnormality. Electronically Signed   By: Rubye Oaks M.D.   On: 01/29/2017 01:38   Dg Hip Unilat W Or Wo Pelvis 2-3 Views Right  Result Date: 01/29/2017 CLINICAL DATA:  Right hip pain EXAM: DG HIP (WITH OR WITHOUT PELVIS) 2-3V RIGHT COMPARISON:  None. FINDINGS: There is a superiorly displaced, predominantly transverse fracture of the right femoral neck. The femoral head remains situated within the acetabulum. The left hip is normal. No other pelvic fracture or diastasis. IMPRESSION: Predominantly transverse, mildly superiorly displaced fracture of the right femoral neck. Electronically Signed   By: Deatra Robinson M.D.   On: 01/29/2017 01:41    Procedures Procedures (including critical care  time)  Medications Ordered in ED  Medications  LORazepam (ATIVAN) tablet 1 mg (not administered)    Or  LORazepam (ATIVAN) injection 1 mg (not administered)  thiamine (VITAMIN B-1) tablet 100 mg (not administered)    Or  thiamine (B-1)  injection 100 mg (not administered)  folic acid (FOLVITE) tablet 1 mg (not administered)  multivitamin with minerals tablet 1 tablet (not administered)  LORazepam (ATIVAN) injection 0-4 mg (not administered)    Followed by  LORazepam (ATIVAN) injection 0-4 mg (not administered)  enoxaparin (LOVENOX) injection 40 mg (not administered)  0.9 %  sodium chloride infusion (not administered)  sodium chloride 0.9 % bolus 1,000 mL (1,000 mLs Intravenous New Bag/Given 01/29/17 0136)  ketorolac (TORADOL) 30 MG/ML injection 30 mg (30 mg Intravenous Given 01/29/17 0216)    This is a 51 y.o. -year-old male presents with hip fracture.    The patient is nontoxic-appearing on exam and vital signs are within normal limits.    Final Clinical Impressions(s) / ED Diagnoses  [facility]  New Prescriptions New Prescriptions   No medications on file   I personally performed the services described in this documentation, which was scribed in my presence. The recorded information has been reviewed and is accurate.       Cy Blamer, MD 01/29/17 [phone]

## 2017-01-29 NOTE — Progress Notes (Signed)
[facility]  No Family present.  Clothes, shoes, cell phone and other belongings placed in personal belongings bag and taken to PACU.  Pt is ok with this transaction and acknowledges this course of action.

## 2017-01-29 NOTE — ED Notes (Signed)
Received call from Dr. Eulah PontMurphy, [facility]. Pt ready for transport, [facility] called.

## 2017-01-30 ENCOUNTER — Ambulatory Visit: Payer: BLUE CROSS/BLUE SHIELD | Attending: Family Medicine | Admitting: Physical Therapy

## 2017-01-30 ENCOUNTER — Encounter (HOSPITAL_COMMUNITY): Payer: Self-pay | Admitting: Orthopedic Surgery

## 2017-01-30 DIAGNOSIS — D72829 Elevated white blood cell count, unspecified: Secondary | ICD-10-CM

## 2017-01-30 DIAGNOSIS — Z720 Tobacco use: Secondary | ICD-10-CM

## 2017-01-30 DIAGNOSIS — S72001A Fracture of unspecified part of neck of right femur, initial encounter for closed fracture: Principal | ICD-10-CM

## 2017-01-30 DIAGNOSIS — F1011 Alcohol abuse, in remission: Secondary | ICD-10-CM

## 2017-01-30 DIAGNOSIS — E119 Type 2 diabetes mellitus without complications: Secondary | ICD-10-CM

## 2017-01-30 DIAGNOSIS — F1010 Alcohol abuse, uncomplicated: Secondary | ICD-10-CM

## 2017-01-30 DIAGNOSIS — R634 Abnormal weight loss: Secondary | ICD-10-CM

## 2017-01-30 DIAGNOSIS — F17200 Nicotine dependence, unspecified, uncomplicated: Secondary | ICD-10-CM

## 2017-01-30 LAB — GLUCOSE, CAPILLARY
GLUCOSE-CAPILLARY: 155 mg/dL — AB (ref 65–99)
Glucose-Capillary: 175 mg/dL — ABNORMAL HIGH (ref 65–99)
Glucose-Capillary: 270 mg/dL — ABNORMAL HIGH (ref 65–99)
Glucose-Capillary: 141 mg/dL — ABNORMAL HIGH (ref 65–99)

## 2017-01-30 LAB — BASIC METABOLIC PANEL WITH GFR
Anion gap: 3 — ABNORMAL LOW (ref 5–15)
BUN: 9 mg/dL (ref 6–20)
CO2: 27 mmol/L (ref 22–32)
Calcium: 8.1 mg/dL — ABNORMAL LOW (ref 8.9–10.3)
Chloride: 103 mmol/L (ref 101–111)
Creatinine, Ser: 0.64 mg/dL (ref 0.61–1.24)
GFR calc Af Amer: >60 mL/min
GFR calc non Af Amer: >60 mL/min
Glucose, Bld: 172 mg/dL — ABNORMAL HIGH (ref 65–99)
Potassium: 3.7 mmol/L (ref 3.5–5.1)
Sodium: 133 mmol/L — ABNORMAL LOW (ref 135–145)

## 2017-01-30 LAB — CBC
HCT: 32.7 % — ABNORMAL LOW (ref 39.0–52.0)
Hemoglobin: 11.2 g/dL — ABNORMAL LOW (ref 13.0–17.0)
MCH: 31.1 pg (ref 26.0–34.0)
MCHC: 34.3 g/dL (ref 30.0–36.0)
MCV: 90.8 fL (ref 78.0–100.0)
Platelets: 163 10*3/uL (ref 150–400)
RBC: 3.6 MIL/uL — AB (ref 4.22–5.81)
RDW: 12.7 % (ref 11.5–15.5)
WBC: 10 10*3/uL (ref 4.0–10.5)

## 2017-01-30 LAB — HIV ANTIBODY (ROUTINE TESTING W REFLEX): HIV SCREEN 4TH GENERATION: NONREACTIVE

## 2017-01-30 LAB — MAGNESIUM: MAGNESIUM: 1.8 mg/dL (ref 1.7–2.4)

## 2017-01-30 MED ORDER — POTASSIUM CHLORIDE CRYS ER 20 MEQ PO TBCR
40.0000 meq | EXTENDED_RELEASE_TABLET | Freq: Once | ORAL | Status: AC
  Administered 2017-01-30: 40 meq via ORAL
  Filled 2017-01-30: qty 2

## 2017-01-30 NOTE — Evaluation (Signed)
Occupational Therapy Evaluation Patient Details Name: Kenneth Roth MRN: [id number] DOB: 05/24/1966 Today's Date: 01/30/2017    History of Present Illness 51 yo male s/p R ARTHROPLASTY BIPOLAR HIP (HEMIARTHROPLASTY)   Clinical Impression  Past Medical History:  Diagnosis Date  . Diabetes mellitus without complication (HCC)   . Polio     Patient is s/p R hemiarthoplasty biopolar hip surgery resulting in functional limitations due to the deficits listed below (see OT problem list). PTA was independent with all adls and lives on 2nd floor apartment. Pt currently requires Mod (A) for LB adls.  Patient will benefit from skilled OT acutely to increase independence and safety with ADLS to allow discharge HHOT with 3n1 RW.      Follow Up Recommendations  Home health OT    Equipment Recommendations  3 in 1 bedside commode;Other (comment) (RW)    Recommendations for Other Services       Precautions / Restrictions Precautions Precautions: Fall Precaution Comments: posterior hip and handout provided and reviewed with teach back Restrictions Weight Bearing Restrictions: No      Mobility Bed Mobility Overal bed mobility: Modified Independent                Transfers Overall transfer level: Needs assistance Equipment used: Rolling walker (2 wheeled) Transfers: Sit to/from Stand Sit to Stand: Min assist         General transfer comment: pt attempting to stand without therapist. pt slightly impulsive. Chair alarm placed for this reason    Balance Overall balance assessment: Needs assistance Sitting-balance support: No upper extremity supported;Feet supported Sitting balance-Leahy Scale: Good     Standing balance support: Bilateral upper extremity supported;During functional activity Standing balance-Leahy Scale: Fair                              ADL                                               Vision Baseline Vision/History: Wears  glasses Wears Glasses: At all times Additional Comments: reports wearing glasses only when he drives     Perception     Praxis      Pertinent Vitals/Pain Pain Assessment: 0-10 Pain Score: 4  Pain Location: R leg  Pain Descriptors / Indicators: Sore;Burning Pain Intervention(s): Monitored during session;Premedicated before session;Repositioned;Limited activity within patient's tolerance     Hand Dominance Right   Extremity/Trunk Assessment Upper Extremity Assessment Upper Extremity Assessment: Overall WFL for tasks assessed   Lower Extremity Assessment Lower Extremity Assessment: RLE deficits/detail RLE Deficits / Details: s/p surg - pt with decr size of R LE compared to L LE   Cervical / Trunk Assessment Cervical / Trunk Assessment: Normal   Communication Communication Communication: No difficulties   Cognition Arousal/Alertness: Awake/alert Behavior During Therapy: WFL for tasks assessed/performed Overall Cognitive Status: Within Functional Limits for tasks assessed                     General Comments  IV R UE bleeding and RN called to room    Exercises       Shoulder Instructions      Home Living Family/patient expects to be discharged to:: Private residence Living Arrangements: Other relatives (roommate and friend) Available Help at Discharge: Friend(s);Available 24 hours/day Type of Home:  Apartment Home Access: Stairs to enter Entrance Stairs-Number of Steps: 10 Entrance Stairs-Rails: Left Home Layout: One level     Bathroom Shower/Tub: Chief Strategy Officer: Standard     Home Equipment: Cane - single point   Additional Comments: does not work       Prior Functioning/Environment Level of Independence: Independent                 OT Problem List: Decreased strength;Decreased activity tolerance;Impaired balance (sitting and/or standing);Decreased safety awareness;Decreased knowledge of use of DME or AE;Decreased  knowledge of precautions;Pain      OT Treatment/Interventions: Self-care/ADL training;Therapeutic exercise;DME and/or AE instruction;Therapeutic activities;Patient/family education;Balance training    OT Goals(Current goals can be found in the care plan section) Acute Rehab OT Goals Patient Stated Goal: none stated at this time OT Goal Formulation: With patient Time For Goal Achievement: 02/13/17 Potential to Achieve Goals: Good  OT Frequency: Min 2X/week   Barriers to D/C:            Co-evaluation              End of Session Equipment Utilized During Treatment: Gait belt Nurse Communication: Mobility status;Precautions  Activity Tolerance: Patient tolerated treatment well Patient left: in chair;with call bell/phone within reach;with chair alarm set  OT Visit Diagnosis: Unsteadiness on feet (R26.81)                ADL either performed or assessed with clinical judgement  Time: 0906-0920 OT Time Calculation (min): 14 min Charges:  OT General Charges $OT Visit: 1 Procedure OT Evaluation $OT Eval Moderate Complexity: 1 Procedure G-Codes:      Mateo Flow   OTR/L Pager: [phone] Office: [phone] .   Boone Master B 01/30/2017, 10:24 AM

## 2017-01-30 NOTE — Evaluation (Signed)
Physical Therapy Evaluation Patient Details Name: Kenneth Roth MRN: [id number] DOB: [birth year] Today's Date: 01/30/2017   History of Present Illness  51 yo male s/p R ARTHROPLASTY BIPOLAR HIP (HEMIARTHROPLASTY)  Clinical Impression  Pt admitted with above diagnosis. Pt currently with functional limitations due to the deficits listed below (see PT Problem List). Pt was able to ambulate with RW with overall fair safety.  Needed cues to sequence steps and RW intiially.  Will have 24 hour care per pt and he is probably close to baseline. Will need f/u and equipment as below.  Will follow acutely.  Pt will benefit from skilled PT to increase their independence and safety with mobility to allow discharge to the venue listed below.      Follow Up Recommendations Home health PT;Supervision/Assistance - 24 hour    Equipment Recommendations  Rolling walker with 5" wheels;3in1 (PT)    Recommendations for Other Services       Precautions / Restrictions Precautions Precautions: Fall Precaution Comments: posterior hip and handout provided and reviewed with teach back Restrictions Weight Bearing Restrictions: Yes RLE Weight Bearing: Weight bearing as tolerated      Mobility  Bed Mobility Overal bed mobility: Needs Assistance Bed Mobility: Supine to Sit     Supine to sit: Min assist     General bed mobility comments: Needed assist to get right LE off bed.  Pt with pain.  Also assisted right LE back into bed at end of rx.   Transfers Overall transfer level: Needs assistance Equipment used: Rolling walker (2 wheeled) Transfers: Sit to/from Stand Sit to Stand: Min guard         General transfer comment:  pt slightly impulsive. Pt was able to stand with min guard assist.  Did not have to assist pt with transfer sit to stand.    Ambulation/Gait Ambulation/Gait assistance: Min guard Ambulation Distance (Feet): 145 Feet Assistive device: Rolling walker (2 wheeled) Gait Pattern/deviations:  Decreased stride length;Decreased step length - right;Decreased stance time - right;Decreased dorsiflexion - right;Decreased weight shift to right;Step-to pattern;Steppage;Antalgic   Gait velocity interpretation: Below normal speed for age/gender General Gait Details: Pt with difficulty advancing right LE with incr time but did not need physical assist to do so.  Had to cue pt to not step too far into Rw with left LE.  Pt was able to ambualte a good distance and feels he is close to baseline with his gait other than that he must use RW at present.  Pt with fair safety overall with RW with ambualation.  Did show signs of fatigue at end of walk.   Stairs            Wheelchair Mobility    Modified Rankin (Stroke Patients Only)       Balance Overall balance assessment: Needs assistance Sitting-balance support: No upper extremity supported;Feet supported Sitting balance-Leahy Scale: Good     Standing balance support: Bilateral upper extremity supported;During functional activity Standing balance-Leahy Scale: Poor Standing balance comment: relies on RW for support                             Pertinent Vitals/Pain Pain Assessment: Faces Faces Pain Scale: Hurts little more Pain Location: R leg  Pain Descriptors / Indicators: Sore;Burning Pain Intervention(s): Limited activity within patient's tolerance;Monitored during session;Premedicated before session;Repositioned  VSS    Home Living Family/patient expects to be discharged to:: Private residence Living Arrangements: Other relatives (roommate and  friend) Available Help at Discharge: Friend(s);Available 24 hours/day Type of Home: Apartment Home Access: Stairs to enter Entrance Stairs-Rails: Left Entrance Stairs-Number of Steps: 10 Home Layout: One level Home Equipment: Cane - single point Additional Comments: does not work     Prior Function Level of Independence: Independent               Hand  Dominance   Dominant Hand: Right    Extremity/Trunk Assessment   Upper Extremity Assessment Upper Extremity Assessment: Defer to OT evaluation    Lower Extremity Assessment Lower Extremity Assessment: RLE deficits/detail RLE Deficits / Details: s/p surg - pt with decr size of R LE compared to L LE.  Pt hip 3/5, knee 0/5, ankle 2-/5    Cervical / Trunk Assessment Cervical / Trunk Assessment: Normal  Communication   Communication: No difficulties  Cognition Arousal/Alertness: Awake/alert Behavior During Therapy: WFL for tasks assessed/performed Overall Cognitive Status: Within Functional Limits for tasks assessed                      General Comments      Exercises General Exercises - Lower Extremity Ankle Circles/Pumps: AROM;Both;5 reps;Supine Quad Sets: AROM;Left;10 reps;Supine Heel Slides: AAROM;Both;10 reps;Supine Hip ABduction/ADduction: AAROM;Both;10 reps;Supine   Assessment/Plan    PT Assessment Patient needs continued PT services  PT Problem List Decreased strength;Decreased activity tolerance;Decreased balance;Decreased mobility;Decreased knowledge of use of DME;Decreased safety awareness;Pain       PT Treatment Interventions DME instruction;Gait training;Functional mobility training;Therapeutic activities;Therapeutic exercise;Balance training;Patient/family education    PT Goals (Current goals can be found in the Care Plan section)  Acute Rehab PT Goals Patient Stated Goal: none stated at this time PT Goal Formulation: With patient Time For Goal Achievement: 02/13/17 Potential to Achieve Goals: Good    Frequency 7X/week   Barriers to discharge        Co-evaluation               End of Session Equipment Utilized During Treatment: Gait belt Activity Tolerance: Patient limited by fatigue;Patient limited by pain Patient left: in bed;with call bell/phone within reach;with SCD's reapplied Nurse Communication: Mobility status PT Visit  Diagnosis: Muscle weakness (generalized) (M62.81);Unsteadiness on feet (R26.81);Pain Pain - Right/Left: Right Pain - part of body: Hip         Time: [id number] PT Time Calculation (min) (ACUTE ONLY): 32 min   Charges:   PT Evaluation $PT Eval Moderate Complexity: 1 Procedure PT Treatments $Gait Training: 8-22 mins   PT G Codes:         Berline LopesDawn F Rahul Malinak 01/30/2017, 2:23 PM  [facility] HospitalDawn Josette Shimabukuro,PT Acute Rehabilitation [phone] (pager)

## 2017-01-30 NOTE — Progress Notes (Addendum)
Physical Therapy Treatment Patient Details Name: Kenneth Roth MRN: [id number] DOB: 10/02/1966 Today's Date: 01/30/2017    History of Present Illness 51 yo male s/p R ARTHROPLASTY BIPOLAR HIP (HEMIARTHROPLASTY)    PT Comments    Pt admitted with above diagnosis. Pt currently with functional limitations due to strength deficits and pain. Pt was unable to ambulate as he was having right Le pain. ATttempted exercises and too painful for pt therefore just assisted pt to get comfortable.   Pt will benefit from skilled PT to increase their independence and safety with mobility to allow discharge to the venue listed below.     Follow Up Recommendations  Home health PT;Supervision/Assistance - 24 hour     Equipment Recommendations  Rolling walker with 5" wheels;3in1 (PT)    Recommendations for Other Services       Precautions / Restrictions Precautions Precautions: Fall Precaution Comments: posterior hip and handout provided and reviewed with teach back Restrictions Weight Bearing Restrictions: No RLE Weight Bearing: Weight bearing as tolerated    Mobility  Bed Mobility Overal bed mobility: Needs Assistance Bed Mobility: Supine to Sit     Supine to sit: Min assist     General bed mobility comments: Pt in bed.  "I cannot walk."  "I am hurting too bad."Attempted exercises as below but pt could not tolerate.  Pt asked to put abduction pillow on as it makes his right LE feel better therefore did so at end of session.   Transfers Overall transfer level: Needs assistance Equipment used: Rolling walker (2 wheeled) Transfers: Sit to/from Stand Sit to Stand: Min guard         General transfer comment:  pt slightly impulsive. Pt was able to stand with min guard assist.  Did not have to assist pt with transfer sit to stand.    Ambulation/Gait Ambulation/Gait assistance: Min guard Ambulation Distance (Feet): 145 Feet Assistive device: Rolling walker (2 wheeled) Gait  Pattern/deviations: Decreased stride length;Decreased step length - right;Decreased stance time - right;Decreased dorsiflexion - right;Decreased weight shift to right;Step-to pattern;Steppage;Antalgic   Gait velocity interpretation: Below normal speed for age/gender General Gait Details: Pt with difficulty advancing right LE with incr time but did not need physical assist to do so.  Had to cue pt to not step too far into Rw with left LE.  Pt was able to ambualte a good distance and feels he is close to baseline with his gait other than that he must use RW at present.  Pt with fair safety overall with RW with ambualation.  Did show signs of fatigue at end of walk.    Stairs            Wheelchair Mobility    Modified Rankin (Stroke Patients Only)       Balance Overall balance assessment: Needs assistance Sitting-balance support: No upper extremity supported;Feet supported Sitting balance-Leahy Scale: Good     Standing balance support: Bilateral upper extremity supported;During functional activity Standing balance-Leahy Scale: Poor Standing balance comment: relies on RW for support                    Cognition Arousal/Alertness: Awake/alert Behavior During Therapy: WFL for tasks assessed/performed Overall Cognitive Status: Within Functional Limits for tasks assessed                      Exercises General Exercises - Lower Extremity Ankle Circles/Pumps: AROM;Both;5 reps;Supine Quad Sets: AROM;Left;10 reps;Supine Heel Slides: AAROM;Both;Supine;Other (comment) (Too painful to  complete over 2 reps) Hip ABduction/ADduction: AAROM;Both;10 reps;Supine    General Comments        Pertinent Vitals/Pain Pain Assessment: Faces Faces Pain Scale: Hurts whole lot Pain Location: right leg Pain Descriptors / Indicators: Sore;Burning Pain Intervention(s): Limited activity within patient's tolerance;Monitored during session;Premedicated before session;Repositioned    VSS Home Living Family/patient expects to be discharged to:: Private residence Living Arrangements: Other relatives (roommate and friend) Available Help at Discharge: Friend(s);Available 24 hours/day Type of Home: Apartment Home Access: Stairs to enter Entrance Stairs-Rails: Left Home Layout: One level Home Equipment: Cane - single point Additional Comments: does not work     Prior Function Level of Independence: Independent          PT Goals (current goals can now be found in the care plan section) Acute Rehab PT Goals Patient Stated Goal: none stated at this time PT Goal Formulation: With patient Time For Goal Achievement: 02/13/17 Potential to Achieve Goals: Good Progress towards PT goals: Progressing toward goals    Frequency    7X/week      PT Plan Current plan remains appropriate    Co-evaluation             End of Session Equipment Utilized During Treatment: Gait belt Activity Tolerance: Patient limited by fatigue;Patient limited by pain Patient left: in bed;with call bell/phone within reach;with SCD's reapplied Nurse Communication: Mobility status PT Visit Diagnosis: Muscle weakness (generalized) (M62.81);Unsteadiness on feet (R26.81);Pain Pain - Right/Left: Right Pain - part of body: Hip     Time: 1308-6578 PT Time Calculation (min) (ACUTE ONLY): 10 min  Charges:  $Gait Training: 8-22 mins $Therapeutic Exercise: 8-22 mins                    G Codes:       Amadeo Garnet Ishi Danser 02/03/2017, 4:04 PM [employer] Acute Rehabilitation [phone] (pager)

## 2017-01-30 NOTE — Progress Notes (Signed)
Oral temp 100.5.  Tylenol parameters state only to give if temp is >/101.  Will monitor. Have notified Dr. Arthor CaptainElmahi.

## 2017-01-30 NOTE — Progress Notes (Signed)
PROGRESS NOTE  Kenneth Roth VWU:[id number] DOB: 1966-03-18 DOA: 01/29/2017 PCP: No PCP Per Patient   Subjective: Denies any complaints this morning, pain is controlled. He feels ready for PT. Denies shakes or headaches reported he had withdrawal symptoms in the past.  Brief History:  51 year old male with a history of postpolio syndrome, DM and EtOH drinking presents to the hospital after waking up at 10:30 PM with right hip pain and inability to bear weight on his right lower extremity. The patient states that he drinks approximately 1 pint of whiskey daily. His last drink was around 7 PM on 01/28/2017. He states that he drank his usual amount whiskey with dinner and went to bed. He does not recall any recent falls or injuries. The patient denies any previous history of coronary artery disease/MI, stroke. He is able to perform his normal activities of daily living without any chest discomfort or shortness of breath. Orthopedics, Dr. Renaye Rakers was consulted and recommended transfer to [facility] for surgical repair.  Assessment/Plan:  Right femoral neck fracture -Presented with right groin pain, x-ray showed right femoral neck fracture. -According the revised cardiac risk index, pt is low medical risk for surgery -Orthopedics consulted, Dr. Eulah Pont did right hemiarthroplasty on 01/29/17 -Pain is controlled with opioids, DVT prophylaxis with Lanoxin. -PT/OT to evaluate and treat.  Leukocytosis -Likely stress demargination, this is resolved. -Patient is afebrile and hemodynamically stable  Diabetes mellitus type 2 -Holding metformin, check hemoglobin A1c. -NovoLog sliding scale  Alcohol abuse -Last drink at 7 PM 01/28/2017, reported drink about 5 shots of whiskey per day. -Alcohol withdrawal protocol  Tobacco abuse -Tobacco cessation discussed, nicotine patch  Hyperlipidemia -Continue statin   Disposition Plan:   Home in 1-2 days  Family Communication:  No Family at  bedside Consultants:  Ortho--Dr. Renaye Rakers Code Status:  FULL  DVT Prophylaxis:  SCSC Lovenox   Procedures: As Listed in Progress Note Above  Antibiotics: None    Objective: Vitals:   01/29/17 1641 01/29/17 2227 01/30/17 0034 01/30/17 0434  BP: 138/89 113/63 110/71 116/67  Pulse: 91 85 87 (!) 107  Resp: 20 18 18 18   Temp: 98.5 F (36.9 C) 99.7 F (37.6 C) 98.9 F (37.2 C) 100 F (37.8 C)  TempSrc: Oral Oral Oral Oral  SpO2: 96% 98% 100% 100%  Weight:      Height:        Intake/Output Summary (Last 24 hours) at 01/30/17 1217 Last data filed at 01/30/17 0958  Gross per 24 hour  Intake             1125 ml  Output              850 ml  Net              275 ml   Weight change:  Exam:   General:  Pt is alert, follows commands appropriately, not in acute distress  HEENT: No icterus, No thrush, No neck mass, [place]/AT  Cardiovascular: RRR, S1/S2, no rubs, no gallops  Respiratory: Diminished breath sounds at the bases. CTA bilaterally, no wheezing, no crackles, no rhonchi  Abdomen: Soft/+BS, non tender, non distended, no guarding  Extremities: No edema, No lymphangitis, No petechiae, No rashes, no synovitis   Data Reviewed: I have personally reviewed following labs and imaging studies Basic Metabolic Panel:  Recent Labs Lab 01/29/17 0143 01/30/17 0635  NA 141 133*  K 3.6 3.7  CL 102 103  CO2  --  27  GLUCOSE 159* 172*  BUN 12 9  CREATININE 0.80 0.64  CALCIUM  --  8.1*  MG  --  1.8   Liver Function Tests:  Recent Labs Lab 01/29/17 1653  AST 26  ALT 36  ALKPHOS 92  BILITOT 0.8  PROT 5.9*  ALBUMIN 3.6   No results for input(s): LIPASE, AMYLASE in the last 168 hours. No results for input(s): AMMONIA in the last 168 hours. Coagulation Profile: No results for input(s): INR, PROTIME in the last 168 hours. CBC:  Recent Labs Lab 01/29/17 0125 01/29/17 0143 01/29/17 1653 01/30/17 0635  WBC 13.8*  --  9.9 10.0  NEUTROABS 10.8*  --  7.3  --    HGB 14.3 14.3 13.7 11.2*  HCT 40.2 42.0 39.6 32.7*  MCV 89.9  --  91.7 90.8  PLT 206  --  184 163   Cardiac Enzymes: No results for input(s): CKTOTAL, CKMB, CKMBINDEX, TROPONINI in the last 168 hours. BNP: Invalid input(s): POCBNP CBG:  Recent Labs Lab 01/29/17 1008 01/29/17 1540 01/29/17 1811 01/29/17 2218 01/30/17 0608  GLUCAP 125* 127* 163* 244* 175*   HbA1C: No results for input(s): HGBA1C in the last 72 hours. Urine analysis:    Component Value Date/Time   COLORURINE YELLOW 12/05/2016 0742   APPEARANCEUR CLEAR 12/05/2016 0742   LABSPEC <1.005 (L) 12/05/2016 0742   PHURINE 5.5 12/05/2016 0742   GLUCOSEU NEGATIVE 12/05/2016 0742   HGBUR NEGATIVE 12/05/2016 0742   BILIRUBINUR negative 12/19/2016 0950   KETONESUR negative 12/19/2016 0950   KETONESUR NEGATIVE 12/05/2016 0742   PROTEINUR negative 12/19/2016 0950   PROTEINUR NEGATIVE 12/05/2016 0742   UROBILINOGEN 0.2 12/19/2016 0950   NITRITE Negative 12/19/2016 0950   NITRITE NEGATIVE 12/05/2016 0742   LEUKOCYTESUR Trace (A) 12/19/2016 0950   Sepsis Labs: @LABRCNTIP (procalcitonin:4,lacticidven:4) ) Recent Results (from the past 240 hour(s))  Surgical pcr screen     Status: None   Collection Time: 01/29/17 11:40 AM  Result Value Ref Range Status   MRSA, PCR NEGATIVE NEGATIVE Final   Staphylococcus aureus NEGATIVE NEGATIVE Final    Comment:        The Xpert SA Assay (FDA approved for NASAL specimens in patients over 50 years of age), is one component of a comprehensive surveillance program.  Test performance has been validated by [facility] for patients greater than or equal to 50 year old. It is not intended to diagnose infection nor to guide or monitor treatment.      Scheduled Meds: . sodium chloride   Intravenous Once  . docusate sodium  100 mg Oral BID  . enoxaparin (LOVENOX) injection  40 mg Subcutaneous Q24H  . folic acid  1 mg Oral Daily  . insulin aspart  0-5 Units Subcutaneous QHS  .  insulin aspart  0-9 Units Subcutaneous TID WC  . ketorolac  15 mg Intravenous Q6H  . LORazepam  0-4 mg Intravenous Q6H   Followed by  . [START ON 01/31/2017] LORazepam  0-4 mg Intravenous Q12H  . multivitamin with minerals  1 tablet Oral Daily  . nicotine  21 mg Transdermal Daily  . senna  1 tablet Oral BID  . thiamine  100 mg Oral Daily   Continuous Infusions: . sodium chloride 75 mL/hr at 01/30/17 0757    Procedures/Studies: Dg Chest 2 View  Result Date: 01/29/2017 CLINICAL DATA:  Preop. EXAM: CHEST  2 VIEW COMPARISON:  Chest radiographs 12/05/2016 FINDINGS: Lower lung volumes from prior exam. Normal  heart size. The lungs are clear. Pulmonary vasculature is normal. No consolidation, pleural effusion, or pneumothorax. No acute osseous abnormalities are seen. IMPRESSION: Low lung volumes without acute abnormality. Electronically Signed   By: Rubye Oaks M.D.   On: 01/29/2017 01:38   Pelvis Portable  Result Date: 01/29/2017 CLINICAL DATA:  Status post right hip fracture fixation. EXAM: PORTABLE PELVIS 1-2 VIEWS COMPARISON:  Earlier films, same date. FINDINGS: There is a Romilda Joy type femoral prosthesis in good position without complicating features a the bony pelvis is intact. IMPRESSION: Right hip prosthesis in good position without complicating features. Electronically Signed   By: Rudie Meyer M.D.   On: 01/29/2017 17:55   Dg Hip Unilat W Or Wo Pelvis 2-3 Views Right  Result Date: 01/29/2017 CLINICAL DATA:  Right hip pain EXAM: DG HIP (WITH OR WITHOUT PELVIS) 2-3V RIGHT COMPARISON:  None. FINDINGS: There is a superiorly displaced, predominantly transverse fracture of the right femoral neck. The femoral head remains situated within the acetabulum. The left hip is normal. No other pelvic fracture or diastasis. IMPRESSION: Predominantly transverse, mildly superiorly displaced fracture of the right femoral neck. Electronically Signed   By: Deatra Robinson M.D.   On: 01/29/2017 01:41     Marcello Tuzzolino A, DO  [facility] Pager [phone]  If 7PM-7AM, please contact night-coverage [email or web address] Password [email or web address] 01/30/2017, 12:17 PM   LOS: 1 day

## 2017-01-30 NOTE — Progress Notes (Signed)
   Assessment: 1 Day Post-Op  S/P Procedure(s) (LRB): ARTHROPLASTY BIPOLAR HIP (HEMIARTHROPLASTY) (Right) by Dr. Jewel Baizeimothy D. Eulah PontMurphy on 01/29/17  Principal Problem:   Closed displaced fracture of right femoral neck (HCC) Active Problems:   Type 2 diabetes mellitus without complication, without long-term current use of insulin (HCC)   Leukocytosis   Weight loss   Tobacco abuse   Alcohol abuse  Doing well early on.  Not OOB yet.  Pain controlled. Discussed importance of smoking cessation.   Also risk of etoh and abrupt discontinuation.   Discussed importance of oral hygiene / risk of infection.  Plan: Advance diet Up with therapy  Incentive spirometry  Weight Bearing: Weight Bearing as Tolerated (WBAT) Posterior hip precautions - reviewed w/ patient. Dressings: Aquacel  VTE prophylaxis: Lovenox, SCDs, ambulation.  ASA 325 mg x30 days postop after d/c. Dispo: PT eval pending.  May be able to go home w/ HHPT.  He lives with a roommate.  Safety / fall concern dt etoh hx.  Subjective: Patient reports pain as mild. Pain controlled with PO meds.  Tolerating diet.  Urinating.  No CP, SOB.  Not yet OOB.  Objective:   VITALS:   Vitals:   01/29/17 1641 01/29/17 2227 01/30/17 0034 01/30/17 0434  BP: 138/89 113/63 110/71 116/67  Pulse: 91 85 87 (!) 107  Resp: 20 18 18 18   Temp: 98.5 F (36.9 C) 99.7 F (37.6 C) 98.9 F (37.2 C) 100 F (37.8 C)  TempSrc: Oral Oral Oral Oral  SpO2: 96% 98% 100% 100%  Weight:      Height:       CBC Latest Ref Rng & Units 01/29/2017 01/29/2017 01/29/2017  WBC 4.0 - 10.5 K/uL 9.9 - 13.8(H)  Hemoglobin 13.0 - 17.0 g/dL 16.113.7 09.614.3 04.514.3  Hematocrit 39.0 - 52.0 % 39.6 42.0 40.2  Platelets 150 - 400 K/uL 184 - 206   BMP Latest Ref Rng & Units 01/29/2017 06/27/2016 11/30/2015  Glucose 65 - 99 mg/dL 409(W159(H) 119(J128(H) 478(G142(H)  BUN 6 - 20 mg/dL 12 9 11   Creatinine 0.61 - 1.24 mg/dL 9.560.80 2.130.68 0.860.66  Sodium 135 - 145 mmol/L 141 138 139  Potassium 3.5 - 5.1 mmol/L 3.6 4.5  4.5  Chloride 101 - 111 mmol/L 102 104 102  CO2 20 - 31 mmol/L - 26 28  Calcium 8.6 - 10.3 mg/dL - 9.6 9.9   Intake/Output      03/04 0701 - 03/05 0700   I.V. (mL/kg) 950 (15.1)   Total Intake(mL/kg) 950 (15.1)   Urine (mL/kg/hr) 600 (0.4)   Blood 250 (0.2)   Total Output 850   Net +100       Urine Occurrence 1 x     Physical Exam: General: NAD.  Upright in bed.  MSK Neurovascularly intact Sensation intact distally Intact pulses distally Dorsiflexion/Plantar flexion intact - Baseline.  chronically limited dt hx of polio. Incision: dressing C/D/I   Albina BilletHenry Calvin Martensen III, PA-C 01/30/2017, 6:33 AM

## 2017-01-30 NOTE — Progress Notes (Signed)
Inpatient Diabetes Program Recommendations  AACE/ADA: New Consensus Statement on Inpatient Glycemic Control (2015)  Target Ranges:  Prepandial:   less than 140 mg/dL      Peak postprandial:   less than 180 mg/dL (1-2 hours)      Critically ill patients:  140 - 180 mg/dL   Lab Results  Component Value Date   GLUCAP 270 (H) 01/30/2017   HGBA1C 6.4 06/27/2016    Review of Glycemic Control  Diabetes history: DM2  Outpatient Diabetes medications: Metformin 1000 mg in am and 500 mg in pm  Current orders for Inpatient glycemic control: sensitive correction scale Novolog 0-9 units TIDAC and 0-5 units QHS  Inpatient Diabetes Program Recommendations:   HgbA1C: No current A1C on file.  Per ADA recommendations "consider performing an A1C on all patients with diabetes or hyperglycemia admitted to the hospital if not performed in the prior 3 months".  Thank you,  Kristine LineaKaren Margalit Leece, RN, MSN Diabetes Coordinator Inpatient Diabetes Program [phone] (Team Pager)

## 2017-01-30 NOTE — Progress Notes (Signed)
Per Dr. Arthor CaptainElmahi, administered Tylenol for oral temp of 100.5.  Will monitor.

## 2017-01-31 DIAGNOSIS — Z0271 Encounter for disability determination: Secondary | ICD-10-CM

## 2017-01-31 LAB — GLUCOSE, CAPILLARY: Glucose-Capillary: 215 mg/dL — ABNORMAL HIGH (ref 65–99)

## 2017-01-31 NOTE — Progress Notes (Signed)
Occupational Therapy Treatment Patient Details Name: Kenneth Roth MRN: [id number] DOB: 12/15/1965 Today's Date: 01/31/2017    History of present illness 51 yo male s/p R ARTHROPLASTY BIPOLAR HIP (HEMIARTHROPLASTY)   OT comments  Pt requires (A) for LB dressing with AE and unable to complete tub/ shower transfer at this time. Pt advised to sponge bath and avoid directly washing on top of wound. Pt agreeable. Pt plans to get reacher for dressing. Pt declines other AE.   Follow Up Recommendations  Home health OT    Equipment Recommendations  3 in 1 bedside commode;Other (comment)    Recommendations for Other Services      Precautions / Restrictions Precautions Precautions: Fall Precaution Comments: posterior hip reviewed again hip precautions. pt again educated to keep precautins for 8 weeks approximately Restrictions Weight Bearing Restrictions: No RLE Weight Bearing: Weight bearing as tolerated       Mobility Bed Mobility               General bed mobility comments: up with PT on arrival  Transfers Overall transfer level: Needs assistance Equipment used: Rolling walker (2 wheeled) Transfers: Sit to/from Stand Sit to Stand: Supervision         General transfer comment:  pt slightly impulsive. Pt was able to stand with supervision.  Did not have to assist pt with transfer sit to stand.  Did need cues for technique to keep RW close to pt.    Balance Overall balance assessment: Needs assistance Sitting-balance support: No upper extremity supported;Feet supported Sitting balance-Leahy Scale: Good     Standing balance support: Bilateral upper extremity supported;During functional activity Standing balance-Leahy Scale: Poor Standing balance comment: relies on RW for support                   ADL Overall ADL's : Needs assistance/impaired                     Lower Body Dressing: Minimal assistance;Adhering to hip precautions;Sit to/from stand;With  adaptive equipment Lower Body Dressing Details (indicate cue type and reason): pt does not like sock aide and does want to use it. pt plans to use a slip on shoe and reacher . Pt plans to purchase reacher Toilet Transfer: Supervision/safety         Tub/Shower Transfer Details (indicate cue type and reason): attempted to transfer with 3n1 into tub and reports extreme pain. Pt reports he will sponge bath until due to pain . pt states "i can't"  Functional mobility during ADLs: Supervision/safety General ADL Comments: Pt educated on AE for LB with reinforcing precautions again. pt with poor recall of precautions. pt states "2 months really?"       Vision                     Perception     Praxis      Cognition   Behavior During Therapy: WFL for tasks assessed/performed Overall Cognitive Status: Within Functional Limits for tasks assessed                         Exercises General Exercises - Lower Extremity Ankle Circles/Pumps: AROM;Both;5 reps;Supine Quad Sets: AROM;Left;10 reps;Supine Heel Slides: AAROM;Both;Supine;Other (comment) (Too painful to complete over 2 reps) Hip ABduction/ADduction: AAROM;Both;10 reps;Supine   Shoulder Instructions       General Comments      Pertinent Vitals/ Pain       Pain Assessment:  No/denies pain Pain Score: 9  Pain Location: right leg Pain Descriptors / Indicators: Sore;Burning Pain Intervention(s): Monitored during session;Premedicated before session;Repositioned  Home Living                                          Prior Functioning/Environment              Frequency  Min 2X/week        Progress Toward Goals  OT Goals(current goals can now be found in the care plan section)  Progress towards OT goals: Progressing toward goals  Acute Rehab OT Goals Patient Stated Goal: none stated at this time OT Goal Formulation: With patient Time For Goal Achievement: 02/13/17 Potential to  Achieve Goals: Good ADL Goals Pt Will Perform Lower Body Dressing: with modified independence;with adaptive equipment;sit to/from stand Additional ADL Goal #1: Pt will complete basic transfer supervision with RW as precursor of adls  Plan Discharge plan remains appropriate    Co-evaluation                 End of Session Equipment Utilized During Treatment: Gait belt;Rolling walker  OT Visit Diagnosis: Unsteadiness on feet (R26.81)   Activity Tolerance Patient tolerated treatment well   Patient Left Other (comment) (ambulating back to room with PT [facility])   Nurse Communication Mobility status;Precautions        Time: 1610-9604 OT Time Calculation (min): 8 min  Charges: OT General Charges $OT Visit: 1 Procedure OT Treatments $Self Care/Home Management : 8-22 mins   Kenneth Roth   OTR/L Pager: [phone] Office: [phone] .    Boone Master B 01/31/2017, 10:04 AM

## 2017-01-31 NOTE — Progress Notes (Addendum)
Physical Therapy Treatment Patient Details Name: Kenneth Roth Payer MRN: [id number] DOB: 03/02/1966 Today's Date: 01/31/2017    History of Present Illness 51 yo male s/p R ARTHROPLASTY BIPOLAR HIP (HEMIARTHROPLASTY)    PT Comments    Pt admitted with above diagnosis. Pt currently with functional limitations due to strength, balance and endurance deficits. Pt is ambulating in room with Modif I.  Occasional cues for safety with RW placement especially with transitions but pt states he has had to figure things out for years and will do so now.   Pt had no LOB with any activities today.  Was able to ascend and descend steps with his own way like he did PTA and it is safe. Pt will benefit from skilled PT to increase their independence and safety with mobility to allow discharge to the venue listed below.     Follow Up Recommendations  HHPT;Supervision - Intermittent     Equipment Recommendations  Rolling walker with 5" wheels;3in1 (PT)    Recommendations for Other Services       Precautions / Restrictions Precautions Precautions: Fall Precaution Comments: posterior hip and handout provided and reviewed with teach back Restrictions Weight Bearing Restrictions: No RLE Weight Bearing: Weight bearing as tolerated    Mobility  Bed Mobility               General bed mobility comments: Pt coming out of bathroom on arrival.   Transfers Overall transfer level: Needs assistance Equipment used: Rolling walker (2 wheeled) Transfers: Sit to/from Stand Sit to Stand: Supervision         General transfer comment:  pt slightly impulsive. Pt was able to stand with supervision.  Did not have to assist pt with transfer sit to stand.  Did need cues for technique to keep RW close to pt.  Ambulation/Gait Ambulation/Gait assistance: Supervision Ambulation Distance (Feet): 350 Feet Assistive device: Rolling walker (2 wheeled) Gait Pattern/deviations: Decreased stride length;Decreased step length -  right;Decreased stance time - right;Decreased dorsiflexion - right;Decreased weight shift to right;Step-to pattern;Steppage;Antalgic   Gait velocity interpretation: Below normal speed for age/gender General Gait Details: Pt with difficulty advancing right LE with incr time but did not need physical assist to do so.  Had to cue pt to not step too far into Rw with left LE.  Pt was able to ambualte a good distance and feels he is close to baseline with his gait other than that he must use RW at present.  Pt with fair safety overall with RW with ambualation.  Did show signs of fatigue at end of walk.    Stairs Stairs: Yes   Stair Management: One rail Left;With cane;Forwards;Step to pattern Number of Stairs: 10 General stair comments: Pt has technique he used PTA where he uses rail on left side and uses cane or friend's shoulder to push on as he hops all the way up and down the stairs on his left foot.   Safe technique and pt is used to this.   Wheelchair Mobility    Modified Rankin (Stroke Patients Only)       Balance Overall balance assessment: Needs assistance Sitting-balance support: No upper extremity supported;Feet supported Sitting balance-Leahy Scale: Good     Standing balance support: Bilateral upper extremity supported;During functional activity Standing balance-Leahy Scale: Poor Standing balance comment: relies on RW for support                    Cognition Arousal/Alertness: Awake/alert Behavior During Therapy: [facility] for  tasks assessed/performed Overall Cognitive Status: Within Functional Limits for tasks assessed                      Exercises General Exercises - Lower Extremity Ankle Circles/Pumps: AROM;Both;5 reps;Supine Quad Sets: AROM;Left;10 reps;Supine Heel Slides: AAROM;Both;Supine;Other (comment) (Too painful to complete over 2 reps) Hip ABduction/ADduction: AAROM;Both;10 reps;Supine    General Comments        Pertinent Vitals/Pain Pain  Assessment: 0-10 Pain Score: 9  Pain Location: right leg Pain Descriptors / Indicators: Sore;Burning Pain Intervention(s): Limited activity within patient's tolerance;Monitored during session;Repositioned    Home Living                      Prior Function            PT Goals (current goals can now be found in the care plan section) Progress towards PT goals: Progressing toward goals    Frequency    7X/week      PT Plan Discharge plan needs to be updated    Co-evaluation             End of Session Equipment Utilized During Treatment: Gait belt Activity Tolerance: Patient limited by fatigue;Patient limited by pain Patient left: in chair (Pt insists to sit on couch and he is leaving today in 45 min) Nurse Communication: Mobility status;Patient requests pain meds PT Visit Diagnosis: Muscle weakness (generalized) (M62.81);Unsteadiness on feet (R26.81);Pain Pain - Right/Left: Right Pain - part of body: Hip     Time: 4098-1191 PT Time Calculation (min) (ACUTE ONLY): 26 min  Charges:  $Gait Training: 8-22 mins $Therapeutic Exercise: 8-22 mins                    G Codes:       Berline Lopes 2017/02/17, 9:53 AM [facility] Acute Rehabilitation [phone] (pager)

## 2017-01-31 NOTE — Discharge Summary (Signed)
Physician Discharge Summary  Kenneth Roth ZOX:[id number] DOB: 05-22-1966 DOA: 01/29/2017  PCP: No PCP Per Patient  Admit date: 01/29/2017 Discharge date: 01/31/2017  Admitted From: Home Disposition: Home  Recommendations for Outpatient Follow-up:  1. Follow up with PCP in 1-2 weeks 2. Please obtain BMP/CBC in one week  Home Health: NA Equipment/Devices:NA  Discharge Condition: Stable CODE STATUS: Full Code Diet recommendation: Diet Carb Modified Fluid consistency: Thin; Room service appropriate? Yes Diet - low sodium heart healthy  Brief/Interim Summary: 51 year old male with a history of postpolio syndrome, DM and EtOH drinking presents to the hospital after waking up at 10:30 PM with right hip pain and inability to bear weight on his right lower extremity. The patient states that he drinks approximately 1 pint of whiskey daily. His last drink was around 7 PM on 01/28/2017. He states that he drank his usual amount whiskey with dinner and went to bed. He does not recall any recent falls or injuries. The patient denies any previous history of coronary artery disease/MI, stroke. He is able to perform his normal activities of daily living without any chest discomfort or shortness of breath. Orthopedics, Dr. Renaye Rakers was consulted and recommended transfer to [facility] for surgical repair.  Discharge Diagnoses:  Principal Problem:   Closed displaced fracture of right femoral neck (HCC) Active Problems:   Type 2 diabetes mellitus without complication, without long-term current use of insulin (HCC)   Leukocytosis   Weight loss   Tobacco abuse   Alcohol abuse   Right femoral neck fracture -Presented with right groin pain, x-ray showed right femoral neck fracture. -According the revised cardiac risk index, pt is low medical risk for surgery -Orthopedics consulted, Dr. Eulah Pont did right hemiarthroplasty on 01/29/17 -Patient wants to go home, PT/OT evaluate patient recommended home PT. -Orthopedics  recommended (and prescribed) Vicodin for pain at home and aspirin for DVT prophylaxis.  Leukocytosis -Likely stress demargination, this is resolved. -Patient is afebrile and hemodynamically stable  Diabetes mellitus type 2 -Controlled diabetes mellitus type 2, hemoglobin A1c is 6.4. -Treated with SSRI in the hospital, metformin restarted on discharge.  Alcohol abuse -Last drink at 7 PM 01/28/2017, reported drink about 5 shots of whiskey per day. -CIWA protocol started but patient did not have significant on: Withdrawal symptoms. -Has some shakes in his hands reported this is normal for him.  Tobacco abuse -Tobacco cessation discussed, nicotine patch  Hyperlipidemia -Continue statin  Discharge Instructions  Discharge Instructions    Diet - low sodium heart healthy    Complete by:  As directed    Increase activity slowly    Complete by:  As directed      Allergies as of 01/31/2017   No Known Allergies     Medication List    STOP taking these medications   cyclobenzaprine 10 MG tablet Commonly known as:  FLEXERIL     TAKE these medications   aspirin EC 325 MG tablet Take 1 tablet (325 mg total) by mouth daily. For 30 days post op for DVT Prophylaxis   atorvastatin 40 MG tablet Commonly known as:  LIPITOR Take 0.5 tablets (20 mg total) by mouth daily. What changed:  how much to take   gabapentin 300 MG capsule Commonly known as:  NEURONTIN Take 1 capsule (300 mg total) by mouth 3 (three) times daily.   HYDROcodone-acetaminophen 5-325 MG tablet Commonly known as:  NORCO Take 1-2 tablets by mouth every 4 (four) hours as needed for moderate pain.   hydrocortisone 2.5 %  cream Apply topically 2 (two) times daily.   ibuprofen 800 MG tablet Commonly known as:  ADVIL,MOTRIN Take 1 tablet (800 mg total) by mouth 3 (three) times daily.   metFORMIN 500 MG 24 hr tablet Commonly known as:  GLUCOPHAGE-XR Take 2 in the morning and 1 in the evening for diabetes    methocarbamol 500 MG tablet Commonly known as:  ROBAXIN Take 1 tablet (500 mg total) by mouth every 6 (six) hours as needed for muscle spasms.      Follow-up Information    MURPHY, TIMOTHY D, MD Follow up in 2 week(s).   Specialty:  Orthopedic Surgery Contact information: [address] [place] [address] [phone]          No Known Allergies  Consultations:  Treatment Team:   Sheral Apleyimothy D Murphy, MD  Procedures (Echo, Carotid, EGD, Colonoscopy, ERCP)   Radiological studies: Dg Chest 2 View  Result Date: 01/29/2017 CLINICAL DATA:  Preop. EXAM: CHEST  2 VIEW COMPARISON:  Chest radiographs 12/05/2016 FINDINGS: Lower lung volumes from prior exam. Normal heart size. The lungs are clear. Pulmonary vasculature is normal. No consolidation, pleural effusion, or pneumothorax. No acute osseous abnormalities are seen. IMPRESSION: Low lung volumes without acute abnormality. Electronically Signed   By: Rubye OaksMelanie  Ehinger M.D.   On: 01/29/2017 01:38   Pelvis Portable  Result Date: 01/29/2017 CLINICAL DATA:  Status post right hip fracture fixation. EXAM: PORTABLE PELVIS 1-2 VIEWS COMPARISON:  Earlier films, same date. FINDINGS: There is a Romilda JoyAustin Moore type femoral prosthesis in good position without complicating features a the bony pelvis is intact. IMPRESSION: Right hip prosthesis in good position without complicating features. Electronically Signed   By: Rudie MeyerP.  Gallerani M.D.   On: 01/29/2017 17:55   Dg Hip Unilat W Or Wo Pelvis 2-3 Views Right  Result Date: 01/29/2017 CLINICAL DATA:  Right hip pain EXAM: DG HIP (WITH OR WITHOUT PELVIS) 2-3V RIGHT COMPARISON:  None. FINDINGS: There is a superiorly displaced, predominantly transverse fracture of the right femoral neck. The femoral head remains situated within the acetabulum. The left hip is normal. No other pelvic fracture or diastasis. IMPRESSION: Predominantly transverse, mildly superiorly displaced fracture of the right  femoral neck. Electronically Signed   By: Deatra RobinsonKevin  Herman M.D.   On: 01/29/2017 01:41     Subjective:  Discharge Exam: Vitals:   01/30/17 1331 01/30/17 1500 01/30/17 2100 01/31/17 0535  BP:  115/64 111/64 114/61  Pulse:  (!) 114 99 100  Resp:  18  18  Temp: (!) 100.5 F (38.1 C)  97.6 F (36.4 C) 98.9 F (37.2 C)  TempSrc:   Oral Oral  SpO2:  100% 100% 98%  Weight:      Height:       General: Pt is alert, awake, not in acute distress Cardiovascular: RRR, S1/S2 +, no rubs, no gallops Respiratory: CTA bilaterally, no wheezing, no rhonchi Abdominal: Soft, NT, ND, bowel sounds + Extremities: no edema, no cyanosis   The results of significant diagnostics from this hospitalization (including imaging, microbiology, ancillary and laboratory) are listed below for reference.    Microbiology: Recent Results (from the past 240 hour(s))  Surgical pcr screen     Status: None   Collection Time: 01/29/17 11:40 AM  Result Value Ref Range Status   MRSA, PCR NEGATIVE NEGATIVE Final   Staphylococcus aureus NEGATIVE NEGATIVE Final    Comment:        The Xpert SA Assay (FDA approved for NASAL specimens  in patients over 38 years of age), is one component of a comprehensive surveillance program.  Test performance has been validated by [facility] for patients greater than or equal to 24 year old. It is not intended to diagnose infection nor to guide or monitor treatment.      Labs: BNP (last 3 results) No results for input(s): BNP in the last 8760 hours. Basic Metabolic Panel:  Recent Labs Lab 01/29/17 0143 01/30/17 0635  NA 141 133*  K 3.6 3.7  CL 102 103  CO2  --  27  GLUCOSE 159* 172*  BUN 12 9  CREATININE 0.80 0.64  CALCIUM  --  8.1*  MG  --  1.8   Liver Function Tests:  Recent Labs Lab 01/29/17 1653  AST 26  ALT 36  ALKPHOS 92  BILITOT 0.8  PROT 5.9*  ALBUMIN 3.6   No results for input(s): LIPASE, AMYLASE in the last 168 hours. No results for input(s):  AMMONIA in the last 168 hours. CBC:  Recent Labs Lab 01/29/17 0125 01/29/17 0143 01/29/17 1653 01/30/17 0635  WBC 13.8*  --  9.9 10.0  NEUTROABS 10.8*  --  7.3  --   HGB 14.3 14.3 13.7 11.2*  HCT 40.2 42.0 39.6 32.7*  MCV 89.9  --  91.7 90.8  PLT 206  --  184 163   Cardiac Enzymes: No results for input(s): CKTOTAL, CKMB, CKMBINDEX, TROPONINI in the last 168 hours. BNP: Invalid input(s): POCBNP CBG:  Recent Labs Lab 01/30/17 0608 01/30/17 1235 01/30/17 1646 01/30/17 2043 01/31/17 0625  GLUCAP 175* 270* 155* 141* 215*   D-Dimer No results for input(s): DDIMER in the last 72 hours. Hgb A1c No results for input(s): HGBA1C in the last 72 hours. Lipid Profile No results for input(s): CHOL, HDL, LDLCALC, TRIG, CHOLHDL, LDLDIRECT in the last 72 hours. Thyroid function studies  Recent Labs  01/29/17 1653  TSH 3.177   Anemia work up No results for input(s): VITAMINB12, FOLATE, FERRITIN, TIBC, IRON, RETICCTPCT in the last 72 hours. Urinalysis    Component Value Date/Time   COLORURINE YELLOW 12/05/2016 0742   APPEARANCEUR CLEAR 12/05/2016 0742   LABSPEC <1.005 (L) 12/05/2016 0742   PHURINE 5.5 12/05/2016 0742   GLUCOSEU NEGATIVE 12/05/2016 0742   HGBUR NEGATIVE 12/05/2016 0742   BILIRUBINUR negative 12/19/2016 0950   KETONESUR negative 12/19/2016 0950   KETONESUR NEGATIVE 12/05/2016 0742   PROTEINUR negative 12/19/2016 0950   PROTEINUR NEGATIVE 12/05/2016 0742   UROBILINOGEN 0.2 12/19/2016 0950   NITRITE Negative 12/19/2016 0950   NITRITE NEGATIVE 12/05/2016 0742   LEUKOCYTESUR Trace (A) 12/19/2016 0950   Sepsis Labs Invalid input(s): PROCALCITONIN,  WBC,  LACTICIDVEN Microbiology Recent Results (from the past 240 hour(s))  Surgical pcr screen     Status: None   Collection Time: 01/29/17 11:40 AM  Result Value Ref Range Status   MRSA, PCR NEGATIVE NEGATIVE Final   Staphylococcus aureus NEGATIVE NEGATIVE Final    Comment:        The Xpert SA Assay  (FDA approved for NASAL specimens in patients over 52 years of age), is one component of a comprehensive surveillance program.  Test performance has been validated by [facility] for patients greater than or equal to 8 year old. It is not intended to diagnose infection nor to guide or monitor treatment.      Time coordinating discharge: Over 30 minutes  SIGNED:   Clint Lipps, MD  [facility] 01/31/2017, 10:22 AM Pager   If  7PM-7AM, please contact night-coverage [email or web address] Password [email or web address]

## 2017-01-31 NOTE — Progress Notes (Signed)
   Assessment: 2 Days Post-Op  S/P Procedure(s) (LRB): ARTHROPLASTY BIPOLAR HIP (HEMIARTHROPLASTY) (Right) by Dr. Jewel Baizeimothy D. Eulah PontMurphy on 01/29/17  Principal Problem:   Closed displaced fracture of right femoral neck (HCC) Active Problems:   Type 2 diabetes mellitus without complication, without long-term current use of insulin (HCC)   Leukocytosis   Weight loss   Tobacco abuse   Alcohol abuse  Doing well.  Stable for dc from an orthopedic perspective.   The patient wants to go home today.  He has 10 steps leading into his residence. OOB Walking well.  Pain controlled. Discussed importance of smoking cessation.   Also risk of etoh and abrupt discontinuation.   Discussed importance of oral hygiene / risk of infection.  Plan: Up with therapy Incentive spirometry  Weight Bearing: Weight Bearing as Tolerated (WBAT) Posterior hip precautions - reviewed w/ patient. Dressings: Aquacel, maintain VTE prophylaxis: Lovenox, SCDs, ambulation.  ASA 325 mg x30 days postop after d/c. Dispo: Home w/ HHPT.  He lives with a roommate.  Safety / fall concern dt etoh hx discussed at length with the patient.  He verbalizes understanding and is determined to dc to home.  Subjective: Patient reports pain as mild. Pain controlled with PO meds.  Tolerating diet.  Urinating. +Flatus, No CP, SOB. OOB walking well w/ walker.  Objective:   VITALS:   Vitals:   01/30/17 1331 01/30/17 1500 01/30/17 2100 01/31/17 0535  BP:  115/64 111/64 114/61  Pulse:  (!) 114 99 100  Resp:  18  18  Temp: (!) 100.5 F (38.1 C)  97.6 F (36.4 C) 98.9 F (37.2 C)  TempSrc:   Oral Oral  SpO2:  100% 100% 98%  Weight:      Height:       CBC Latest Ref Rng & Units 01/30/2017 01/29/2017 01/29/2017  WBC 4.0 - 10.5 K/uL 10.0 9.9 -  Hemoglobin 13.0 - 17.0 g/dL 11.2(L) 13.7 14.3  Hematocrit 39.0 - 52.0 % 32.7(L) 39.6 42.0  Platelets 150 - 400 K/uL 163 184 -   BMP Latest Ref Rng & Units 01/30/2017 01/29/2017 06/27/2016  Glucose 65 -  99 mg/dL 829(F172(H) 621(H159(H) 086(V128(H)  BUN 6 - 20 mg/dL 9 12 9   Creatinine 0.61 - 1.24 mg/dL 7.840.64 6.960.80 2.950.68  Sodium 135 - 145 mmol/L 133(L) 141 138  Potassium 3.5 - 5.1 mmol/L 3.7 3.6 4.5  Chloride 101 - 111 mmol/L 103 102 104  CO2 22 - 32 mmol/L 27 - 26  Calcium 8.9 - 10.3 mg/dL 8.1(L) - 9.6   Intake/Output      03/05 0701 - 03/06 0700   P.O. 375   IV Piggyback 0   Total Intake(mL/kg) 375 (6)   Urine (mL/kg/hr) 1125 (0.7)   Total Output 1125   Net -750       Urine Occurrence 1 x     Physical Exam: General: NAD.  Upright in bed.  MSK Neurovascularly intact Sensation intact distally Intact pulses distally Dorsiflexion/Plantar flexion intact - Baseline.  chronically limited dt hx of polio. Incision: dressing C/D/I   Albina BilletHenry Calvin Martensen III, PA-C 01/31/2017, 6:17 AM

## 2017-01-31 NOTE — Care Management Note (Signed)
Case Management Note  Patient Details  Name: Kenneth Roth MRN: [id number] Date of Birth: 05/14/1966  Subjective/Objective:   51 yr old gentleman s/p right hip hemiarthroplasty.                  Action/Plan: Case manager spoke with patient concerning discharge plan and DME needs. Choice was offered for Home Health Agency, referral was called to Janeice RobinsonKaren Nusbaumm, [facility] Liaison. Patietn will have family support at discharge.    Expected Discharge Date:  01/31/17               Expected Discharge Plan:  Home w Home Health Services  In-House Referral:  NA  Discharge planning Services  CM Consult  Post Acute Care Choice:  Durable Medical Equipment, Home Health Choice offered to:  Patient  DME Arranged:  3-N-1, Walker rolling DME Agency:     HH Arranged:  PT, OT HH Agency:  [facility]  Status of Service:  Completed, signed off  If discussed at Long Length of Stay Meetings, dates discussed:    Additional Comments:  Durenda GuthrieBrady, Colum Colt Naomi, RN 01/31/2017, 11:37 AM

## 2017-01-31 NOTE — Progress Notes (Signed)
Reviewed discharge instructions/medications with patient.  Answered his questions.  Patient is waiting on equipment to be delivered to the room.

## 2017-01-31 NOTE — Progress Notes (Signed)
Pt ambulated from bed to bathroom with no assistant and with no complications.

## 2017-02-06 ENCOUNTER — Encounter: Payer: BLUE CROSS/BLUE SHIELD | Admitting: Physical Therapy

## 2017-02-13 ENCOUNTER — Encounter: Payer: BLUE CROSS/BLUE SHIELD | Admitting: Physical Therapy

## 2017-02-17 ENCOUNTER — Encounter (HOSPITAL_COMMUNITY): Payer: Self-pay | Admitting: Oncology

## 2017-02-17 ENCOUNTER — Telehealth: Payer: Self-pay | Admitting: Family Medicine

## 2017-02-17 ENCOUNTER — Emergency Department (HOSPITAL_COMMUNITY)
Admission: EM | Admit: 2017-02-17 | Discharge: 2017-02-18 | Disposition: A | Discharge status: Home / Self Care | Payer: BLUE CROSS/BLUE SHIELD | Attending: Emergency Medicine | Admitting: Emergency Medicine

## 2017-02-17 DIAGNOSIS — E119 Type 2 diabetes mellitus without complications: Secondary | ICD-10-CM | POA: Diagnosis not present

## 2017-02-17 DIAGNOSIS — F17200 Nicotine dependence, unspecified, uncomplicated: Secondary | ICD-10-CM | POA: Diagnosis not present

## 2017-02-17 DIAGNOSIS — Z7984 Long term (current) use of oral hypoglycemic drugs: Secondary | ICD-10-CM | POA: Diagnosis not present

## 2017-02-17 DIAGNOSIS — Z79899 Other long term (current) drug therapy: Secondary | ICD-10-CM | POA: Diagnosis not present

## 2017-02-17 DIAGNOSIS — K644 Residual hemorrhoidal skin tags: Secondary | ICD-10-CM | POA: Diagnosis not present

## 2017-02-17 DIAGNOSIS — K6289 Other specified diseases of anus and rectum: Secondary | ICD-10-CM | POA: Diagnosis present

## 2017-02-17 DIAGNOSIS — Z7982 Long term (current) use of aspirin: Secondary | ICD-10-CM | POA: Insufficient documentation

## 2017-02-17 DIAGNOSIS — K5900 Constipation, unspecified: Secondary | ICD-10-CM | POA: Diagnosis not present

## 2017-02-17 MED ORDER — LIDOCAINE HCL 2 % EX GEL
1.0000 "application " | Freq: Once | CUTANEOUS | Status: AC
  Administered 2017-02-17: 1 via TOPICAL
  Filled 2017-02-17: qty 11

## 2017-02-17 NOTE — Telephone Encounter (Signed)
Pt would like to have a prescription for Hemorid's   Please advise: [phone]

## 2017-02-17 NOTE — ED Provider Notes (Addendum)
[facility]-EMERGENCY DEPT Provider Note   CSN: [id number] Arrival date & time: 02/17/17  2249  By signing my name below, I, Elder Negus, attest that this documentation has been prepared under the direction and in the presence of Jenesis Martin, MD. Electronically Signed: Elder Negus, Scribe. 02/17/17. 11:54 PM.   History   Chief Complaint Chief Complaint  Patient presents with  . Rectal Pain    HPI Kenneth Roth is a 51 y.o. male with history of diabetes, hyperlipidemia, and alcoholism who presents to the ED for evaluation of rectal pain. This patient states that for "the last 5 days" he has experienced frequent bowel movements which are not diarrheal. Nonbloody. He is also experiencing rectal pain continuously; he attributes this to hemorrhoids. Pain is severe, described as burning. Of note, the patient did undergo R hip arthroplasty on 3/4; was not placed on post-op antibiotics.  The history is provided by the patient. No language interpreter was used.  Abdominal Pain   This is a new problem. The current episode started more than 2 days ago. The problem occurs constantly. The problem has not changed since onset.The pain is associated with an unknown factor. The pain is located in the rectum. The quality of the pain is burning. The pain is moderate. Pertinent negatives include fever, diarrhea, vomiting, dysuria, hematuria and arthralgias. Nothing aggravates the symptoms. Nothing relieves the symptoms. Past workup does not include GI consult. His past medical history does not include PUD.    Past Medical History:  Diagnosis Date  . Diabetes mellitus without complication (HCC)   . Polio     Patient Active Problem List   Diagnosis Date Noted  . Tobacco abuse 01/30/2017  . Alcohol abuse 01/30/2017  . Closed displaced fracture of right femoral neck (HCC) 01/29/2017  . Leukocytosis 01/29/2017  . Weight loss 01/29/2017  . History of poliomyelitis 08/22/2016  . Hyperlipidemia  08/22/2016  . Type 2 diabetes mellitus without complication, without long-term current use of insulin (HCC) 08/22/2016  . HERNIA 09/04/2007    Past Surgical History:  Procedure Laterality Date  . HERNIA REPAIR    . HIP ARTHROPLASTY Right 01/29/2017   Procedure: ARTHROPLASTY BIPOLAR HIP (HEMIARTHROPLASTY);  Surgeon: Sheral Apley, MD;  Location[facility];  Service: Orthopedics;  Laterality: Right;       Home Medications    Prior to Admission medications   Medication Sig Start Date End Date Taking? Authorizing Provider  aspirin EC 325 MG tablet Take 1 tablet (325 mg total) by mouth daily. For 30 days post op for DVT Prophylaxis 01/29/17  Yes Lucretia Kern Martensen III, PA-C  atorvastatin (LIPITOR) 40 MG tablet Take 0.5 tablets (20 mg total) by mouth daily. Patient taking differently: Take 40 mg by mouth daily.  08/22/16  Yes Peyton Najjar, MD  metFORMIN (GLUCOPHAGE-XR) 500 MG 24 hr tablet Take 2 in the morning and 1 in the evening for diabetes Patient taking differently: Take 500-1,000 mg by mouth 2 (two) times daily. Take 2 in the morning and 1 in the evening for diabetes 08/29/16  Yes Peyton Najjar, MD  methocarbamol (ROBAXIN) 500 MG tablet Take 1 tablet (500 mg total) by mouth every 6 (six) hours as needed for muscle spasms. 01/29/17  Yes Lucretia Kern Martensen III, PA-C  gabapentin (NEURONTIN) 300 MG capsule Take 1 capsule (300 mg total) by mouth 3 (three) times daily. Patient not taking: Reported on 01/29/2017 12/19/16   Doristine Bosworth, MD  HYDROcodone-acetaminophen (NORCO) 5-325 MG tablet Take 1-2  tablets by mouth every 4 (four) hours as needed for moderate pain. Patient not taking: Reported on 02/17/2017 01/29/17   Albina BilletHenry Calvin Martensen III, PA-C  hydrocortisone 2.5 % cream Apply topically 2 (two) times daily. Patient not taking: Reported on 02/17/2017 12/19/16   Doristine BosworthZoe A Stallings, MD  ibuprofen (ADVIL,MOTRIN) 800 MG tablet Take 1 tablet (800 mg total) by mouth 3 (three) times daily. Patient  not taking: Reported on 01/29/2017 12/05/16   Courteney Lyn Mackuen, MD    Family History Family History  Problem Relation Age of Onset  . Diabetes Mother   . Diabetes Sister   . Diabetes Brother   . Diabetes Sister     Social History Social History  Substance Use Topics  . Smoking status: Current Every Day Smoker  . Smokeless tobacco: Current User  . Alcohol use Yes     Comment: 1 beer every day     Allergies   Patient has no known allergies.   Review of Systems Review of Systems  Constitutional: Negative for chills and fever.  HENT: Negative for ear pain and sore throat.   Eyes: Negative for pain and visual disturbance.  Respiratory: Negative for cough and shortness of breath.   Cardiovascular: Negative for chest pain and palpitations.  Gastrointestinal: Positive for rectal pain. Negative for abdominal pain, blood in stool, diarrhea and vomiting.  Genitourinary: Negative for dysuria and hematuria.  Musculoskeletal: Negative for arthralgias and back pain.  Skin: Negative for color change and rash.  Neurological: Negative for seizures and syncope.  All other systems reviewed and are negative.    Physical Exam Updated Vital Signs BP 140/86 (BP Location: Left Arm)   Pulse 92   Temp 98.2 F (36.8 C) (Oral)   Resp 20   Ht 5\' 7"  (1.702 m)   Wt 139 lb (63 kg)   SpO2 97%   BMI 21.77 kg/m   Physical Exam  Constitutional: He appears well-developed and well-nourished.  HENT:  Head: Normocephalic and atraumatic.  Mouth/Throat: Oropharynx is clear and moist. No oropharyngeal exudate.  Eyes: Conjunctivae are normal.  Neck: Neck supple.  Cardiovascular: Normal rate, regular rhythm and normal heart sounds.   No murmur heard. Pulmonary/Chest: Effort normal and breath sounds normal. No respiratory distress.  Abdominal: Soft. There is no tenderness.  Genitourinary:  Genitourinary Comments: External non-bleeding hemorrhoids.   Musculoskeletal: He exhibits no edema.    Neurological: He is alert. He displays normal reflexes.  Skin: Skin is warm and dry.  Surgical scar to the R hip which clean, dry, and intact.  Psychiatric: He has a normal mood and affect.  Nursing note and vitals reviewed.    ED Treatments / Results  Labs (all labs ordered are listed, but only abnormal results are displayed) Labs Reviewed - No data to display  EKG  EKG Interpretation None       Radiology No results found.  Procedures Procedures (including critical care time)  Medications Ordered in ED Medications  lidocaine (XYLOCAINE) 2 % jelly 1 application (not administered)   DEA database:   02/08/2017 02/08/2017 OXYCODONE- ACETAMINOPHEN 5- 325 [id number] 40 8 0 0 [id number] [address]MARTENSEN HENRY CALVIN III [place] [id number] [address] [place], [place] Advani, Judith 1966/10/16 [address] [place], [place] [id number] 04 UNK 02/04/2017 02/03/2017 HYDROCODONE- ACETAMIN 5- 325 MG [id number] 60 5 0 0 [id number] [address]MARTENSEN HENRY CALVIN III [place] [id number] [address] [place] [place], [place] Akens, Saverio 1966/10/16 [address] [place], [place] [id number] 04 60 01/31/2017 01/31/2017 HYDROCODONE- ACETAMIN 5- 325  MG [id number] 60 5 0 0 [id number] [address] [place], [place] [id number] Jearld Fenton, [place] Degroote, Jawan 1966-07-17 [address] [place], [place] [address] 04   Final Clinical Impressions(s) / ED Diagnoses  Constipation and hemorhoids:  Has been on narcotics post op and I suspect has been constipation.    Gas pain from narcotics is constipated.  Patient feeling better in the ED.  Exam and vitals are benign and reassuring.  No signs of a surgical abdomen.  Pain would not have gone away with GI cocktail if this was a surgical issue.  Start daily miralax therapy. Patient is well appearing, normal vital signs. Based on history and exam patient has been appropriately medically screened and emergency conditions excluded. Patient is stable for  discharge at this time. Strict return precautions given for fever, lethargy, intractable vomiting or pain especially pain that localizes to the right lower quadrant, inability to pass gas from your bottom or worsening symptomsor anyfurther problems or concerns. Follow up with your PMD in 2 days for recheck.  I personally performed the services described in this documentation, which was scribed in my presence. The recorded information has been reviewed and is accurate.      Cy Blamer, MD 02/18/17 [id number]    Cy Blamer, MD 02/18/17 [phone]

## 2017-02-17 NOTE — ED Triage Notes (Signed)
Pt has hx of hemorrhoids.  Over the last 5 days pt has been having frequent BM's however denies diarrhea.  States that d/t increased BM his hemorrhoids have flared up.  Pt rates his pain 10/10, burning in nature.

## 2017-02-18 ENCOUNTER — Encounter (HOSPITAL_COMMUNITY): Payer: Self-pay | Admitting: Emergency Medicine

## 2017-02-18 ENCOUNTER — Emergency Department (HOSPITAL_COMMUNITY): Payer: BLUE CROSS/BLUE SHIELD

## 2017-02-18 MED ORDER — POLYETHYLENE GLYCOL 3350 17 G PO PACK: 17.0000 g | PACK | Freq: Every day | ORAL | 0 refills | Status: DC

## 2017-02-18 MED ORDER — HYDROCORTISONE ACETATE 25 MG RE SUPP: 25.0000 mg | Freq: Two times a day (BID) | RECTAL | 0 refills | Status: DC

## 2017-02-20 ENCOUNTER — Encounter: Payer: BLUE CROSS/BLUE SHIELD | Admitting: Physical Therapy

## 2017-02-24 MED ORDER — HYDROCORTISONE 2.5 % RE CREA: 1.0000 "application " | TOPICAL_CREAM | Freq: Two times a day (BID) | RECTAL | 0 refills | Status: DC

## 2017-02-24 NOTE — Telephone Encounter (Signed)
After his call here on 02/17/2017, the patient presented to the ED. He was found to have external non-bleeding hemorrhoids, thought likely due to constipation secondary to opiate pain medication.  He was advised to follow-up with his PCP. Per the EMR, he does not have a PCP, but I note several visits here for acute and chronic problems, and prior to that, a similar pattern at [facility]) and Family Medicine Kathryne Sharper).  LM for patient to arrange follow-up in the next month.

## 2017-02-24 NOTE — Telephone Encounter (Signed)
Let him know if he has bleeding he should follow up in clinic for an appointment as blood from the rectum is not always due to hemorrhoids.

## 2017-02-24 NOTE — Telephone Encounter (Signed)
Pt advised, no bleeding now

## 2017-02-27 ENCOUNTER — Encounter: Payer: BLUE CROSS/BLUE SHIELD | Admitting: Physical Therapy

## 2017-04-06 ENCOUNTER — Ambulatory Visit (INDEPENDENT_AMBULATORY_CARE_PROVIDER_SITE_OTHER): Payer: BLUE CROSS/BLUE SHIELD | Admitting: Physician Assistant

## 2017-04-06 ENCOUNTER — Encounter: Payer: Self-pay | Admitting: Physician Assistant

## 2017-04-06 VITALS — BP 121/80 | HR 95 | Temp 97.5°F | Resp 18 | Ht 67.0 in | Wt 120.2 lb

## 2017-04-06 DIAGNOSIS — R634 Abnormal weight loss: Secondary | ICD-10-CM

## 2017-04-06 DIAGNOSIS — E784 Other hyperlipidemia: Secondary | ICD-10-CM | POA: Diagnosis not present

## 2017-04-06 DIAGNOSIS — Z1211 Encounter for screening for malignant neoplasm of colon: Secondary | ICD-10-CM

## 2017-04-06 DIAGNOSIS — D72829 Elevated white blood cell count, unspecified: Secondary | ICD-10-CM | POA: Diagnosis not present

## 2017-04-06 DIAGNOSIS — F17200 Nicotine dependence, unspecified, uncomplicated: Secondary | ICD-10-CM

## 2017-04-06 DIAGNOSIS — E7849 Other hyperlipidemia: Secondary | ICD-10-CM

## 2017-04-06 DIAGNOSIS — E119 Type 2 diabetes mellitus without complications: Secondary | ICD-10-CM | POA: Diagnosis not present

## 2017-04-06 DIAGNOSIS — Z0289 Encounter for other administrative examinations: Secondary | ICD-10-CM

## 2017-04-06 LAB — POCT GLYCOSYLATED HEMOGLOBIN (HGB A1C): Hemoglobin A1C: 9.6

## 2017-04-06 LAB — POCT URINALYSIS DIP (MANUAL ENTRY)
BILIRUBIN UA: NEGATIVE
BILIRUBIN UA: NEGATIVE mg/dL
Blood, UA: NEGATIVE
Glucose, UA: NEGATIVE mg/dL
LEUKOCYTES UA: NEGATIVE
NITRITE UA: NEGATIVE
PROTEIN UA: NEGATIVE mg/dL
Spec Grav, UA: 1.01 (ref 1.010–1.025)
Urobilinogen, UA: 0.2 E.U./dL
pH, UA: 6 (ref 5.0–8.0)

## 2017-04-06 LAB — POC MICROSCOPIC URINALYSIS (UMFC): MUCUS RE: ABSENT

## 2017-04-06 MED ORDER — METFORMIN HCL ER 500 MG PO TB24: 1000.0000 mg | ORAL_TABLET | Freq: Two times a day (BID) | ORAL | 3 refills | Status: AC

## 2017-04-06 MED ORDER — ATORVASTATIN CALCIUM 40 MG PO TABS: 40.0000 mg | ORAL_TABLET | Freq: Every day | ORAL | 3 refills | Status: AC

## 2017-04-06 NOTE — Progress Notes (Signed)
Patient ID: Kenneth Roth, male     DOB: 1966-10-02, 51 y.o.    MRN: [id number]  PCP: Patient, No Pcp Per  Chief Complaint  Patient presents with  . Medication Refill    metformin and Cholesterol meds   . paperwork    Subjective:   This patient is new to me (previously followed by Dr. Alwyn Ren) and presents for evaluation of diabetes and hyperlipidemia, and in need of disability forms completed.  On 01/29/2017 he presented to the ED with sudden onset RIGHT hip pain. He denied a fall or any type of injury, and stated that he was in bed and suddenly developed the pain. He was found to have a fracture of the RIGHT hip (displaced RIGHT femoral neck fracture), and underwent RIGHT hemiarthroplasty with Dr. Margarita Rana.  Has been eating more, and not exercising much since hip fracture and surgical repair. Continues to smoke, 1.5 ppd "average." Since his surgery, he has stopped drinking alcohol.  Prior, he was drinking 3-4 shots/night. Chart review reveals it was 1 pint of whiskey.  Last drink 01/28/2017. 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 Follow-up with orthopedics has been notable for inflammation of the injury/surgical site. At his visit 03/08/2017 he was prescribed a steroid taper and muscle relaxer. Notes from his visit yesterday are not yet available.  As far back as 05/2016 he was noted to be losing weight. Acute abdominal radiographs at that time (he was also experiencing flank pain) were negative for any findings suggestive of malignancy. Did not have CT abdomen/pelvis ordered, due to cost. Another CXR 11/2016 (also for flank pain) was also negative. Additional radiographs of the chest and pelvis during hospitalization 01/2017 also negative.  TSH in hospital was normal. LFTs were normal. HIV was non-reactive. Mild leukocytosis during hospitalization thought likely reactive, resolved by discharge.  Prior to 01/2017,  diabetes was controlled. A1C 11/30/2015 6.7% A1C 06/27/2016 6.4% During the hospitalization, glucose monitoring revealed initial readings 120's-130's, then increased to 270.  Prior to the hip fracture and surgery, he was ambulating without assistive device. Post-polio syndrome affected the RIGHT leg, but he was able to ambulate and perform all his own ADLs. Since the fracture, however, he is not able to ambulate without either a cane or walker, cannot don/doff his own socks and shoes, and isn't able to carry things, due to balance issues and the need to use the cane/walker. To bathe himself, he takes the walker into the shower stall.   Review of Systems As above. No CP, SOB, HA, dizziness.  No cough, sore throat, difficulty swallowing. No vision changes. No fever, chills. No abdominal pain. No nausea, vomiting, diarrhea or constipation. No urinary urgency, frequency or burning. No hematuria. No melena or hematochezia. No new muscle/joint pain or weakness.  Prior to Admission medications   Medication Sig Start Date End Date Taking? Authorizing Provider  aspirin EC 325 MG tablet Take 1 tablet (325 mg total) by mouth daily. For 30 days post op for DVT Prophylaxis 01/29/17  Yes Albina Billet III, PA-C  hydrocortisone (ANUSOL-HC) 2.5 % rectal cream Place 1 application rectally 2 (two) times daily. 02/24/17  Yes Doristine Bosworth, MD  hydrocortisone (ANUSOL-HC) 25 MG suppository Place 1 suppository (25 mg total) rectally 2 (two) times daily. For 7 days 02/18/17  Yes Palumbo, April, MD  hydrocortisone 2.5 % cream Apply topically 2 (two) times daily. 12/19/16  Yes Stallings, Zoe A,  MD  metFORMIN (GLUCOPHAGE-XR) 500 MG 24 hr tablet Take 2 in the morning and 1 in the evening for diabetes Patient taking differently: Take 500-1,000 mg by mouth 2 (two) times daily. Take 2 in the morning and 1 in the evening for diabetes 08/29/16  Yes Peyton NajjarHopper, David H, MD  methocarbamol (ROBAXIN) 500 MG tablet Take 1  tablet (500 mg total) by mouth every 6 (six) hours as needed for muscle spasms. 01/29/17  Yes Albina BilletMartensen, Henry Calvin III, PA-C  polyethylene glycol [facility](MIRALAX) packet Take 17 g by mouth daily. 02/18/17  Yes Palumbo, April, MD  ibuprofen (ADVIL,MOTRIN) 800 MG tablet Take 1 tablet (800 mg total) by mouth 3 (three) times daily. Patient not taking: Reported on 01/29/2017 12/05/16   Abelino DerrickMackuen, Courteney Lyn, MD     No Known Allergies   Patient Active Problem List   Diagnosis Date Noted  . Smoker 01/30/2017  . History of alcohol abuse 01/30/2017  . Leukocytosis 01/29/2017  . Weight loss 01/29/2017  . History of poliomyelitis 08/22/2016  . Hyperlipidemia 08/22/2016  . Type 2 diabetes mellitus without complication, without long-term current use of insulin (HCC) 08/22/2016  . 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     Family History  Problem Relation Age of Onset  . Diabetes Mother   . Diabetes Sister   . Diabetes Brother   . Diabetes Sister      Social History   Social History  . Marital status: Legally Separated    Spouse name: since 2013  . Number of children: 0  . Years of education: 2 years college   Occupational History  . unemployed    Social History Main Topics  . Smoking status: Current Every Day Smoker  . Smokeless tobacco: Current User  . Alcohol use Yes     Comment: 1 beer every day  . Drug use: No  . Sexual activity: Not on file   Other Topics Concern  . Not on file   Social History Narrative   Originally from [place], but moved to and became a citizen of [place]. Came to the [place] in 2007.   Lives with a roommate.   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         Objective:  Physical Exam  Constitutional: He is oriented to person, place, and time. He appears well-developed and well-nourished. He is active and cooperative. No distress.  BP 121/80   Pulse 95   Temp 97.5 F (36.4 C) (Oral)   Resp 18   Ht 5\' 7"  (1.702 m)   Wt 120 lb 3.2 oz  (54.5 kg)   SpO2 96%   BMI 18.83 kg/m   HENT:  Head: Normocephalic and atraumatic.  Right Ear: Hearing normal.  Left Ear: Hearing normal.  Eyes: Conjunctivae are normal. No scleral icterus.  Neck: Normal range of motion. Neck supple. No thyromegaly present.  Cardiovascular: Normal rate, regular rhythm and normal heart sounds.   Pulses:      Radial pulses are 2+ on the right side, and 2+ on the left side.  No LE edema.  Pulmonary/Chest: Effort normal and breath sounds normal.  Musculoskeletal:  Ambulates with a cane. Hikes the hip to move the RIGHT leg forward.  Unable to flex hip or extend knee against resistance, barely able to perform these movements without resistance. Uses his hand to grab his pant leg to move the RIGHT leg onto and off the examination table.   Lymphadenopathy:  Head (right side): No tonsillar, no preauricular, no posterior auricular and no occipital adenopathy present.       Head (left side): No tonsillar, no preauricular, no posterior auricular and no occipital adenopathy present.    He has no cervical adenopathy.       Right: No supraclavicular adenopathy present.       Left: No supraclavicular adenopathy present.  Neurological: He is alert and oriented to person, place, and time. No sensory deficit.  Skin: Skin is warm, dry and intact. No rash noted. No cyanosis or erythema. Nails show no clubbing.  Psychiatric: He has a normal mood and affect. His speech is normal and behavior is normal.    Wt Readings from Last 3 Encounters:  04/06/17 120 lb 3.2 oz (54.5 kg)  02/17/17 139 lb (63 kg)  01/29/17 139 lb (63 kg)  12/19/16 124 lb 12/05/16 140 08/22/16 119 06/27/16 122 11/30/15 130 05/22/14 131 01/29/13 137  Results for orders placed or performed in visit on 04/06/17  POCT urinalysis dipstick  Result Value Ref Range   Color, UA yellow yellow   Clarity, UA clear clear   Glucose, UA negative negative mg/dL   Bilirubin, UA negative negative    Ketones, POC UA negative negative mg/dL   Spec Grav, UA 1.610 9.604 - 1.025   Blood, UA negative negative   pH, UA 6.0 5.0 - 8.0   Protein Ur, POC negative negative mg/dL   Urobilinogen, UA 0.2 0.2 or 1.0 E.U./dL   Nitrite, UA Negative Negative   Leukocytes, UA Negative Negative  POCT Microscopic Urinalysis ([facility])  Result Value Ref Range   WBC,UR,HPF,POC None None WBC/hpf   RBC,UR,HPF,POC None None RBC/hpf   Bacteria None None, Too numerous to count   Mucus Absent Absent   Epithelial Cells, UR Per Microscopy Few (A) None, Too numerous to count cells/hpf  POCT glycosylated hemoglobin (Hb A1C)  Result Value Ref Range   Hemoglobin A1C 9.6        Assessment & Plan:   Problem List Items Addressed This Visit    Hyperlipidemia    Re-order atorvastatin. It was stopped during hospitalization 01/2017 for hip fracture. Patient resumed it upon discharge to home, though it was not on his medication list.       Relevant Medications   atorvastatin (LIPITOR) 40 MG tablet   Other Relevant Orders   Comprehensive metabolic panel   Lipid panel   Type 2 diabetes mellitus without complication, without long-term current use of insulin (HCC) - Primary    Increase metformin xr to 1000 mg BID. Increase oral hydration. Increase physical activity. Reduce starches and sweets.      Relevant Medications   metFORMIN (GLUCOPHAGE-XR) 500 MG 24 hr tablet   atorvastatin (LIPITOR) 40 MG tablet   Other Relevant Orders   Comprehensive metabolic panel   Microalbumin, urine   POCT glycosylated hemoglobin (Hb A1C) (Completed)   Leukocytosis    Anticipate normal results today.      Relevant Orders   CBC with Differential/Platelet   Weight loss    19 lb loss since hospital discharge, but likely started prior to 11/2015. Needs colonoscopy. Await remaining labs. Refuses CT scanning, due to cost. Encouraged smoking cessation. Encouraged regaining control of diabetes and increasing exercise to address both  diabetes and rebuild some muscle mass.      Smoker    Encouraged smoking cessation. He is not interested.       Other Visit Diagnoses    Screening for  colon cancer       Relevant Orders   Ambulatory referral to Gastroenterology   Loss of weight       Relevant Orders   POCT urinalysis dipstick (Completed)   POCT Microscopic Urinalysis ([facility]) (Completed)   Encounter for completion of form with patient       disability forms completed.       Return in about 3 months (around 07/07/2017) for re-evaluation.   Fernande Bras, PA-C Primary [facility] at [facility]

## 2017-04-06 NOTE — Assessment & Plan Note (Addendum)
Re-order atorvastatin. It was stopped during hospitalization 01/2017 for hip fracture. Patient resumed it upon discharge to home, though it was not on his medication list.

## 2017-04-06 NOTE — Assessment & Plan Note (Signed)
Increase metformin xr to 1000 mg BID. Increase oral hydration. Increase physical activity. Reduce starches and sweets.

## 2017-04-06 NOTE — Assessment & Plan Note (Signed)
Anticipate normal results today.

## 2017-04-06 NOTE — Assessment & Plan Note (Signed)
19 lb loss since hospital discharge, but likely started prior to 11/2015. Needs colonoscopy. Await remaining labs. Refuses CT scanning, due to cost. Encouraged smoking cessation. Encouraged regaining control of diabetes and increasing exercise to address both diabetes and rebuild some muscle mass.

## 2017-04-06 NOTE — Assessment & Plan Note (Signed)
Encouraged smoking cessation. He is not interested.

## 2017-04-06 NOTE — Patient Instructions (Addendum)
1. INCREASE the metformin to 2 tablets (1000 mg) twice each day).  2. CUT BACK on sweets and starches.  3. INCREASE your exercise to burn more calories. Consider water exercise.  4. Restart atorvastatin (Lipitor).  5. Please schedule an eye exam. If you do not have a specialist, I recommend: [facility]  [address], [place], [place] [address]  Phone: [phone](336) [phone]  [facility] [address], [place], [place] [address]  Phone: [phone](336) [phone]  [facility] [address], [place], [place] [address]  Phone: [phone](336) [phone]      IF you received an x-ray today, you will receive an invoice from [facility]. Please contact [facility] at [phone] with questions or concerns regarding your invoice.   IF you received labwork today, you will receive an invoice from [facility]. Please contact [facility] at [phone] with questions or concerns regarding your invoice.   Our billing staff will not be able to assist you with questions regarding bills from these companies.  You will be contacted with the lab results as soon as they are available. The fastest way to get your results is to activate your My Chart account. Instructions are located on the last page of this paperwork. If you have not heard from us regarding the results in 2 weeks, please contact this office.    Did you know that you begin to benefit from quitting smoking within the first twenty minutes? It's TRUE.  At 20 minutes: -blood pressure decreases -pulse rate drops -body temperature of hands and feet increases  At 8 hours: -carbon monoxide level in blood drops to normal -oxygen level in blood increases to normal  At 24 hours: -the chance of heart attack decreases  At 48 hours: -nerve endings start regrowing -ability to smell and taste is enhanced  2 weeks-3 months: -circulation improves -walking becomes easier -lung function improves  1-9 months: -coughing, sinus congestion,  fatigue and shortness of breath decreases  1 year: -excess risk of heart disease is decreased to HALF that of a smoker  5 years: Stroke risk is reduced to that of people who have never smoked  10 years: -risk of lung cancer drops to as little as half that of continuing smokers -risk of cancer of the mouth, throat, esophagus, bladder, kidney and pancreas decreases -risk of ulcer decreases  15 years -risk of heart disease is now similar to that of people who have never smoked -risk of death returns to nearly the level of people who have never smoked

## 2017-04-07 LAB — CBC WITH DIFFERENTIAL/PLATELET
BASOS ABS: 0.1 10*3/uL (ref 0.0–0.2)
BASOS: 1 %
EOS (ABSOLUTE): 0.4 10*3/uL (ref 0.0–0.4)
EOS: 4 %
HEMOGLOBIN: 16.2 g/dL (ref 13.0–17.7)
Hematocrit: 47.5 % (ref 37.5–51.0)
IMMATURE GRANS (ABS): 0 10*3/uL (ref 0.0–0.1)
Immature Granulocytes: 0 %
LYMPHS: 29 %
Lymphocytes Absolute: 3 10*3/uL (ref 0.7–3.1)
MCH: 29.8 pg (ref 26.6–33.0)
MCHC: 34.1 g/dL (ref 31.5–35.7)
MCV: 87 fL (ref 79–97)
MONOCYTES: 9 %
Monocytes Absolute: 0.9 10*3/uL (ref 0.1–0.9)
Neutrophils Absolute: 6.1 10*3/uL (ref 1.4–7.0)
Neutrophils: 57 %
Platelets: 272 10*3/uL (ref 150–379)
RBC: 5.44 x10E6/uL (ref 4.14–5.80)
RDW: 13.5 % (ref 12.3–15.4)
WBC: 10.5 10*3/uL (ref 3.4–10.8)

## 2017-04-07 LAB — COMPREHENSIVE METABOLIC PANEL
ALBUMIN: 4.5 g/dL (ref 3.5–5.5)
ALT: 24 IU/L (ref 0–44)
AST: 14 IU/L (ref 0–40)
Albumin/Globulin Ratio: 2 (ref 1.2–2.2)
Alkaline Phosphatase: 124 IU/L — ABNORMAL HIGH (ref 39–117)
BILIRUBIN TOTAL: 0.2 mg/dL (ref 0.0–1.2)
BUN / CREAT RATIO: 20 (ref 9–20)
BUN: 14 mg/dL (ref 6–24)
CO2: 22 mmol/L (ref 18–29)
Calcium: 10.2 mg/dL (ref 8.7–10.2)
Chloride: 98 mmol/L (ref 96–106)
Creatinine, Ser: 0.71 mg/dL — ABNORMAL LOW (ref 0.76–1.27)
GFR calc non Af Amer: 109 mL/min/{1.73_m2} (ref >59)
GFR, EST AFRICAN AMERICAN: 126 mL/min/{1.73_m2} (ref >59)
GLUCOSE: 219 mg/dL — AB (ref 65–99)
Globulin, Total: 2.2 g/dL (ref 1.5–4.5)
Potassium: 4.6 mmol/L (ref 3.5–5.2)
Sodium: 138 mmol/L (ref 134–144)
TOTAL PROTEIN: 6.7 g/dL (ref 6.0–8.5)

## 2017-04-07 LAB — LIPID PANEL
Chol/HDL Ratio: 3.5 ratio (ref 0.0–5.0)
Cholesterol, Total: 163 mg/dL (ref 100–199)
HDL: 47 mg/dL (ref >39)
LDL Calculated: 91 mg/dL (ref 0–99)
Triglycerides: 123 mg/dL (ref 0–149)
VLDL CHOLESTEROL CAL: 25 mg/dL (ref 5–40)

## 2017-04-07 LAB — MICROALBUMIN, URINE: Microalbumin, Urine: <3 ug/mL

## 2017-05-04 ENCOUNTER — Encounter: Payer: Self-pay | Admitting: Gastroenterology

## 2017-06-26 ENCOUNTER — Ambulatory Visit (AMBULATORY_SURGERY_CENTER): Payer: Self-pay | Admitting: *Deleted

## 2017-06-26 VITALS — Ht 67.0 in | Wt 121.0 lb

## 2017-06-26 DIAGNOSIS — Z1211 Encounter for screening for malignant neoplasm of colon: Secondary | ICD-10-CM

## 2017-06-26 MED ORDER — NA SULFATE-K SULFATE-MG SULF 17.5-3.13-1.6 GM/177ML PO SOLN: ORAL | 0 refills | Status: DC

## 2017-06-26 NOTE — Progress Notes (Signed)
Patient denies any allergies to eggs or soy. Patient denies any problems with anesthesia/sedation. Patient denies any oxygen use at home and does not take any diet/weight loss medications. EMMI education assisgned to patient on colonoscopy, this was explained and instructions given to patient. 

## 2017-07-10 ENCOUNTER — Encounter: Payer: BLUE CROSS/BLUE SHIELD | Admitting: Gastroenterology

## 2017-07-19 NOTE — Addendum Note (Signed)
Addendum  created 07/19/17 1203 by Achille Rich, MD   Sign clinical note

## 2017-07-31 ENCOUNTER — Emergency Department (HOSPITAL_COMMUNITY)
Admission: EM | Admit: 2017-07-31 | Discharge: 2017-07-31 | Disposition: A | Discharge status: Home / Self Care | Payer: Medicaid Other | Attending: Emergency Medicine | Admitting: Emergency Medicine

## 2017-07-31 ENCOUNTER — Emergency Department (HOSPITAL_COMMUNITY): Payer: Medicaid Other

## 2017-07-31 DIAGNOSIS — Z7984 Long term (current) use of oral hypoglycemic drugs: Secondary | ICD-10-CM | POA: Diagnosis not present

## 2017-07-31 DIAGNOSIS — Y999 Unspecified external cause status: Secondary | ICD-10-CM | POA: Insufficient documentation

## 2017-07-31 DIAGNOSIS — S6991XA Unspecified injury of right wrist, hand and finger(s), initial encounter: Secondary | ICD-10-CM | POA: Diagnosis present

## 2017-07-31 DIAGNOSIS — F17210 Nicotine dependence, cigarettes, uncomplicated: Secondary | ICD-10-CM | POA: Insufficient documentation

## 2017-07-31 DIAGNOSIS — Y939 Activity, unspecified: Secondary | ICD-10-CM | POA: Insufficient documentation

## 2017-07-31 DIAGNOSIS — Z79899 Other long term (current) drug therapy: Secondary | ICD-10-CM | POA: Insufficient documentation

## 2017-07-31 DIAGNOSIS — W010XXA Fall on same level from slipping, tripping and stumbling without subsequent striking against object, initial encounter: Secondary | ICD-10-CM | POA: Insufficient documentation

## 2017-07-31 DIAGNOSIS — Z96641 Presence of right artificial hip joint: Secondary | ICD-10-CM | POA: Insufficient documentation

## 2017-07-31 DIAGNOSIS — Y929 Unspecified place or not applicable: Secondary | ICD-10-CM | POA: Insufficient documentation

## 2017-07-31 DIAGNOSIS — S62322A Displaced fracture of shaft of third metacarpal bone, right hand, initial encounter for closed fracture: Secondary | ICD-10-CM | POA: Diagnosis not present

## 2017-07-31 DIAGNOSIS — E119 Type 2 diabetes mellitus without complications: Secondary | ICD-10-CM | POA: Diagnosis not present

## 2017-07-31 MED ORDER — ACETAMINOPHEN 325 MG PO TABS
650.0000 mg | ORAL_TABLET | Freq: Once | ORAL | Status: AC
  Administered 2017-07-31: 650 mg via ORAL
  Filled 2017-07-31: qty 2

## 2017-07-31 MED ORDER — OXYCODONE-ACETAMINOPHEN 5-325 MG PO TABS: 1.0000 | ORAL_TABLET | Freq: Four times a day (QID) | ORAL | 0 refills | Status: AC | PRN

## 2017-07-31 MED ORDER — OXYCODONE-ACETAMINOPHEN 5-325 MG PO TABS
1.0000 | ORAL_TABLET | Freq: Once | ORAL | Status: AC
  Administered 2017-07-31: 1 via ORAL
  Filled 2017-07-31: qty 1

## 2017-07-31 NOTE — Discharge Instructions (Signed)
You were evaluated in the emergency department for right hand pain after a fall. You have a mildly displaced third metacarpal fracture. This was splinted. Dr. Merlyn LotKuzma (hand surgeon) would like to see you in the office for reevaluation. Please call his office tomorrow and schedule an appointment for follow-up, he is expecting your phone call. For mild/moderate pain and take 400 mg of ibuprofen +650 mg of Tylenol every 8 hours. For more severe, breakthrough pain you can take 1 Percocet every 6 hours.  Percocet is a narcotic pain medication that has risk of overdose, death, dependence and abuse. Do not consume alcohol, drive or use heavy machinery while taking this medication. Do not leave unattended around children. The emergency department has a strict policy regarding prescription of narcotic medications. We are unable to refill this medication in the emergency department for chronic pain. Contact your primary care provider or specialist for chronic pain management and refill on narcotic medications.

## 2017-07-31 NOTE — ED Triage Notes (Signed)
Pt states he tripped and fell Friday night, rt hand caught his weight, rt hand swollen

## 2017-07-31 NOTE — ED Provider Notes (Signed)
[facility]-EMERGENCY DEPT Provider Note   CSN: [id number] Arrival date & time: 07/31/17  1646     History   Chief Complaint Chief Complaint  Patient presents with  . Hand Injury    Rt    HPI Kenneth Roth is a 51 y.o. male with h/o T2DM, tobacco and alcohol abuse presents to ED for evaluation of gradually worsening right hand pain and swelling x 2 days after mechanical fall on right hand. He went to work after for the last 2 days but pain and swelling became worse today. Denies head trauma. Denies numbness or tingling to right fingers. Pt is right handed. Works as a Conservation officer, nature. Of note, pt had right femoral neck fx s/p hemiarthroplasty by Dr. Eulah Pont on March 2018 after mechanical fall.   HPI  Past Medical History:  Diagnosis Date  . Closed displaced fracture of right femoral neck (HCC) 01/29/2017  . Diabetes mellitus without complication (HCC)   . Hyperlipidemia   . Intra-abdominal hernia 09/04/2007   Qualifier: Diagnosis of  By: Abby Potash    . Polio     Patient Active Problem List   Diagnosis Date Noted  . Smoker 01/30/2017  . History of alcohol abuse 01/30/2017  . Leukocytosis 01/29/2017  . Weight loss 01/29/2017  . History of poliomyelitis 08/22/2016  . Hyperlipidemia 08/22/2016  . Type 2 diabetes mellitus without complication, without long-term current use of insulin (HCC) 08/22/2016  . 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 HERNIA REPAIR    . HIP ARTHROPLASTY Right 01/29/2017   Procedure: ARTHROPLASTY BIPOLAR HIP (HEMIARTHROPLASTY);  Surgeon: Sheral Apley, MD;  Location: [facility] OR;  Service: Orthopedics;  Laterality: Right;       Home Medications    Prior to Admission medications   Medication Sig Start Date End Date Taking? Authorizing Provider  aspirin EC 325 MG tablet Take 1 tablet (325 mg total) by mouth daily. For 30 days post op for DVT Prophylaxis 01/29/17   Albina Billet III, PA-C    atorvastatin (LIPITOR) 40 MG tablet Take 1 tablet (40 mg total) by mouth daily. 04/06/17   Porfirio Oar, PA-C  metFORMIN (GLUCOPHAGE-XR) 500 MG 24 hr tablet Take 2 tablets (1,000 mg total) by mouth 2 (two) times daily. 04/06/17   Porfirio Oar, PA-C  Na Sulfate-K Sulfate-Mg Sulf 17.5-3.13-1.6 GM/180ML SOLN Suprep (no substitutions)-TAKE AS DIRECTED. 06/26/17   Rachael Fee, MD  oxyCODONE-acetaminophen (PERCOCET/ROXICET) 5-325 MG tablet Take 1 tablet by mouth every 6 (six) hours as needed for severe pain. 07/31/17 08/02/17  Liberty Handy, PA-C    Family History Family History  Problem Relation Age of Onset  . Diabetes Mother   . Diabetes Sister   . Diabetes Brother   . Diabetes Sister   . Colon cancer Neg Hx     Social History Social History  Substance Use Topics  . Smoking status: Current Every Day Smoker    Packs/day: 1.50    Types: Cigarettes  . Smokeless tobacco: Current User  . Alcohol use 14.4 oz/week    24 Shots of liquor per week     Comment: 4 shots a day per pt. not on Sundays     Allergies   Patient has no known allergies.   Review of Systems Review of Systems  Musculoskeletal: Positive for arthralgias and joint swelling.  Skin: Negative for wound.  Neurological: Negative for weakness and numbness.     Physical Exam Updated Vital  Signs BP 118/74   Pulse 80   Temp (!) 97.4 F (36.3 C) (Oral)   Resp 16   Ht 5\' 7"  (1.702 m)   Wt 56.2 kg (124 lb)   SpO2 96%   BMI 19.42 kg/m   Physical Exam  Constitutional: He is oriented to person, place, and time. He appears well-developed and well-nourished. No distress.  NAD.  HENT:  Head: Normocephalic and atraumatic.  Right Ear: External ear normal.  Left Ear: External ear normal.  Nose: Nose normal.  Eyes: Conjunctivae and EOM are normal. No scleral icterus.  Neck: Normal range of motion. Neck supple.  Cardiovascular: Normal rate, regular rhythm, normal heart sounds and intact distal pulses.   No  murmur heard. Brisk cap refill to right finger tips  Pulmonary/Chest: Effort normal and breath sounds normal. He has no wheezes.  Musculoskeletal: Normal range of motion. He exhibits edema, tenderness and deformity.  Focal tenderness, edema and deformity to right 3rd metacarpal Able to wiggle fingers and make fist, right 3rd digit is shortened slightly compared to left No tenderness over wrist or scaphoid Good thumb opposition   Neurological: He is alert and oriented to person, place, and time.  Sensation to light touch in median, ulnar and radial nerve distribution intact bilaterally  Skin: Skin is warm and dry. Capillary refill takes less than 2 seconds.  Psychiatric: He has a normal mood and affect. His behavior is normal. Judgment and thought content normal.  Nursing note and vitals reviewed.    ED Treatments / Results  Labs (all labs ordered are listed, but only abnormal results are displayed) Labs Reviewed - No data to display  EKG  EKG Interpretation None       Radiology Dg Hand Complete Right  Result Date: 07/31/2017 CLINICAL DATA:  Status post fall 3 days ago with right hand pain EXAM: RIGHT HAND - COMPLETE 3+ VIEW COMPARISON:  None. FINDINGS: There is comminuted displaced fracture of the third mid metacarpal. There is no dislocation. IMPRESSION: Fracture third metacarpal. Electronically Signed   By: Sherian Rein M.D.   On: 07/31/2017 17:44    Procedures Procedures (including critical care time)  Medications Ordered in ED Medications  oxyCODONE-acetaminophen (PERCOCET/ROXICET) 5-325 MG per tablet 1 tablet (1 tablet Oral Given 07/31/17 1902)  acetaminophen (TYLENOL) tablet 650 mg (650 mg Oral Given 07/31/17 1902)     Initial Impression / Assessment and Plan / ED Course  I have reviewed the triage vital signs and the nursing notes.  Pertinent labs & imaging results that were available during my care of the patient were reviewed by me and considered in my medical  decision making (see chart for details).    51 yo male with h/o tobacco abuse presents to ED for evaluation of right hand pain s/p mechanical fall 2 days ago. States he tripped and fell on outstretched right hand. Denies ETOH use or preceding CP, palpitations, light-headedness. X-ray shows mildly displaced right 3rd metacarpal fx. Extremity is neurovascularly intact. Spoke to Dr. Merlyn Lot who recommends splint and f/u in clinic. Discussed x-ray, ED tx and discharge plan with pt who verbalized understanding and is agreeable with plan.   Final Clinical Impressions(s) / ED Diagnoses   Final diagnoses:  Closed displaced fracture of shaft of third metacarpal bone of right hand, initial encounter    New Prescriptions Discharge Medication List as of 07/31/2017  8:57 PM    START taking these medications   Details  oxyCODONE-acetaminophen (PERCOCET/ROXICET) 5-325 MG tablet Take 1  tablet by mouth every 6 (six) hours as needed for severe pain., Starting Mon 07/31/2017, Until Wed 08/02/2017, Print         Liberty HandyGibbons, Breylan Lefevers J, PA-C 07/31/17 2137    Rolan BuccoBelfi, Melanie, MD 07/31/17 2218

## 2017-08-02 ENCOUNTER — Other Ambulatory Visit: Payer: Self-pay | Admitting: Orthopedic Surgery

## 2017-08-03 ENCOUNTER — Encounter (HOSPITAL_BASED_OUTPATIENT_CLINIC_OR_DEPARTMENT_OTHER): Payer: Self-pay | Admitting: *Deleted

## 2017-08-04 ENCOUNTER — Encounter (HOSPITAL_BASED_OUTPATIENT_CLINIC_OR_DEPARTMENT_OTHER)
Admission: RE | Admit: 2017-08-04 | Discharge: 2017-08-04 | Disposition: A | Discharge status: Home / Self Care | Payer: Medicaid Other | Source: Ambulatory Visit | Attending: Orthopedic Surgery | Admitting: Orthopedic Surgery

## 2017-08-04 DIAGNOSIS — Z01812 Encounter for preprocedural laboratory examination: Secondary | ICD-10-CM | POA: Insufficient documentation

## 2017-08-04 DIAGNOSIS — S62322A Displaced fracture of shaft of third metacarpal bone, right hand, initial encounter for closed fracture: Secondary | ICD-10-CM | POA: Insufficient documentation

## 2017-08-04 DIAGNOSIS — X58XXXA Exposure to other specified factors, initial encounter: Secondary | ICD-10-CM | POA: Insufficient documentation

## 2017-08-04 LAB — BASIC METABOLIC PANEL
ANION GAP: 8 (ref 5–15)
BUN: 23 mg/dL — ABNORMAL HIGH (ref 6–20)
CO2: 24 mmol/L (ref 22–32)
Calcium: 9.6 mg/dL (ref 8.9–10.3)
Chloride: 104 mmol/L (ref 101–111)
Creatinine, Ser: 0.76 mg/dL (ref 0.61–1.24)
GFR calc Af Amer: >60 mL/min (ref >60)
GFR calc non Af Amer: >60 mL/min (ref >60)
GLUCOSE: 107 mg/dL — AB (ref 65–99)
POTASSIUM: 4.3 mmol/L (ref 3.5–5.1)
Sodium: 136 mmol/L (ref 135–145)

## 2017-08-08 ENCOUNTER — Encounter (HOSPITAL_BASED_OUTPATIENT_CLINIC_OR_DEPARTMENT_OTHER): Payer: Self-pay | Admitting: *Deleted

## 2017-08-08 ENCOUNTER — Ambulatory Visit (HOSPITAL_BASED_OUTPATIENT_CLINIC_OR_DEPARTMENT_OTHER)
Admission: RE | Admit: 2017-08-08 | Discharge: 2017-08-08 | Disposition: A | Discharge status: Home / Self Care | Payer: Medicaid Other | Source: Ambulatory Visit | Attending: Orthopedic Surgery | Admitting: Orthopedic Surgery

## 2017-08-08 ENCOUNTER — Encounter (HOSPITAL_BASED_OUTPATIENT_CLINIC_OR_DEPARTMENT_OTHER)
Admission: RE | Disposition: A | Discharge status: Home / Self Care | Payer: Self-pay | Source: Ambulatory Visit | Attending: Orthopedic Surgery

## 2017-08-08 ENCOUNTER — Ambulatory Visit (HOSPITAL_BASED_OUTPATIENT_CLINIC_OR_DEPARTMENT_OTHER): Payer: Medicaid Other | Admitting: Anesthesiology

## 2017-08-08 DIAGNOSIS — Z7984 Long term (current) use of oral hypoglycemic drugs: Secondary | ICD-10-CM | POA: Insufficient documentation

## 2017-08-08 DIAGNOSIS — Z79899 Other long term (current) drug therapy: Secondary | ICD-10-CM | POA: Diagnosis not present

## 2017-08-08 DIAGNOSIS — W19XXXA Unspecified fall, initial encounter: Secondary | ICD-10-CM | POA: Insufficient documentation

## 2017-08-08 DIAGNOSIS — F17210 Nicotine dependence, cigarettes, uncomplicated: Secondary | ICD-10-CM | POA: Insufficient documentation

## 2017-08-08 DIAGNOSIS — E785 Hyperlipidemia, unspecified: Secondary | ICD-10-CM | POA: Insufficient documentation

## 2017-08-08 DIAGNOSIS — E119 Type 2 diabetes mellitus without complications: Secondary | ICD-10-CM | POA: Diagnosis not present

## 2017-08-08 DIAGNOSIS — S62322A Displaced fracture of shaft of third metacarpal bone, right hand, initial encounter for closed fracture: Secondary | ICD-10-CM | POA: Insufficient documentation

## 2017-08-08 DIAGNOSIS — Y92090 Kitchen in other non-institutional residence as the place of occurrence of the external cause: Secondary | ICD-10-CM | POA: Insufficient documentation

## 2017-08-08 HISTORY — PX: FINGER CLOSED REDUCTION: SHX1633

## 2017-08-08 LAB — GLUCOSE, CAPILLARY
GLUCOSE-CAPILLARY: 137 mg/dL — AB (ref 65–99)
Glucose-Capillary: 123 mg/dL — ABNORMAL HIGH (ref 65–99)

## 2017-08-08 SURGERY — CLOSED REDUCTION, FRACTURE, METACARPAL BONE
Anesthesia: Monitor Anesthesia Care | Site: Hand | Laterality: Right

## 2017-08-08 MED ORDER — CEFAZOLIN SODIUM-DEXTROSE 2-4 GM/100ML-% IV SOLN
2.0000 g | INTRAVENOUS | Status: AC
  Administered 2017-08-08: 2 g via INTRAVENOUS

## 2017-08-08 MED ORDER — ONDANSETRON HCL 4 MG/2ML IJ SOLN: 4.0000 mg | Freq: Once | INTRAMUSCULAR | Status: DC | PRN

## 2017-08-08 MED ORDER — CEFAZOLIN SODIUM-DEXTROSE 2-4 GM/100ML-% IV SOLN
INTRAVENOUS | Status: AC
  Filled 2017-08-08: qty 100

## 2017-08-08 MED ORDER — FENTANYL CITRATE (PF) 100 MCG/2ML IJ SOLN
INTRAMUSCULAR | Status: AC
  Filled 2017-08-08: qty 2

## 2017-08-08 MED ORDER — FENTANYL CITRATE (PF) 100 MCG/2ML IJ SOLN
50.0000 ug | INTRAMUSCULAR | Status: AC | PRN
  Administered 2017-08-08 (×3): 50 ug via INTRAVENOUS

## 2017-08-08 MED ORDER — ONDANSETRON HCL 4 MG/2ML IJ SOLN
INTRAMUSCULAR | Status: AC
  Filled 2017-08-08: qty 2

## 2017-08-08 MED ORDER — LACTATED RINGERS IV SOLN
INTRAVENOUS | Status: DC
  Administered 2017-08-08: 13:00:00 via INTRAVENOUS

## 2017-08-08 MED ORDER — CHLORHEXIDINE GLUCONATE 4 % EX LIQD: 60.0000 mL | Freq: Once | CUTANEOUS | Status: DC

## 2017-08-08 MED ORDER — ROPIVACAINE HCL 5 MG/ML IJ SOLN
INTRAMUSCULAR | Status: DC | PRN
  Administered 2017-08-08: 30 mL via PERINEURAL

## 2017-08-08 MED ORDER — ONDANSETRON HCL 4 MG/2ML IJ SOLN
INTRAMUSCULAR | Status: DC | PRN
  Administered 2017-08-08: 4 mg via INTRAVENOUS

## 2017-08-08 MED ORDER — PROPOFOL 500 MG/50ML IV EMUL
INTRAVENOUS | Status: DC | PRN
  Administered 2017-08-08: 75 ug/kg/min via INTRAVENOUS

## 2017-08-08 MED ORDER — MIDAZOLAM HCL 2 MG/2ML IJ SOLN
1.0000 mg | INTRAMUSCULAR | Status: DC | PRN
  Administered 2017-08-08 (×3): 1 mg via INTRAVENOUS

## 2017-08-08 MED ORDER — MIDAZOLAM HCL 2 MG/2ML IJ SOLN
INTRAMUSCULAR | Status: AC
  Filled 2017-08-08: qty 2

## 2017-08-08 MED ORDER — SCOPOLAMINE 1 MG/3DAYS TD PT72: 1.0000 | MEDICATED_PATCH | Freq: Once | TRANSDERMAL | Status: DC | PRN

## 2017-08-08 MED ORDER — FENTANYL CITRATE (PF) 100 MCG/2ML IJ SOLN: 25.0000 ug | INTRAMUSCULAR | Status: DC | PRN

## 2017-08-08 MED ORDER — OXYCODONE-ACETAMINOPHEN 5-325 MG PO TABS: ORAL_TABLET | ORAL | 0 refills | Status: DC

## 2017-08-08 SURGICAL SUPPLY — 73 items
BANDAGE ACE 3X5.8 VEL STRL LF (GAUZE/BANDAGES/DRESSINGS) ×1 IMPLANT
BANDAGE ACE 4X5 VEL STRL LF (GAUZE/BANDAGES/DRESSINGS) ×1 IMPLANT
BIT DRILL 1.1 MINI QC NONSTRL (BIT) ×1 IMPLANT
BLADE MINI RND TIP GREEN BEAV (BLADE) IMPLANT
BLADE SURG 15 STRL LF DISP TIS (BLADE) ×2 IMPLANT
BLADE SURG 15 STRL SS (BLADE) ×4
BNDG CMPR 9X4 STRL LF SNTH (GAUZE/BANDAGES/DRESSINGS) ×1
BNDG ELASTIC 2X5.8 VLCR STR LF (GAUZE/BANDAGES/DRESSINGS) IMPLANT
BNDG ESMARK 4X9 LF (GAUZE/BANDAGES/DRESSINGS) ×1 IMPLANT
BNDG GAUZE ELAST 4 BULKY (GAUZE/BANDAGES/DRESSINGS) ×2 IMPLANT
CHLORAPREP W/TINT 26ML (MISCELLANEOUS) ×2 IMPLANT
CORD BIPOLAR FORCEPS 12FT (ELECTRODE) ×2 IMPLANT
COVER BACK TABLE 60X90IN (DRAPES) ×2 IMPLANT
COVER MAYO STAND STRL (DRAPES) ×2 IMPLANT
CUFF TOURNIQUET SINGLE 18IN (TOURNIQUET CUFF) ×2 IMPLANT
DRAPE EXTREMITY T 121X128X90 (DRAPE) ×2 IMPLANT
DRAPE OEC MINIVIEW 54X84 (DRAPES) IMPLANT
DRAPE SURG 17X23 STRL (DRAPES) ×2 IMPLANT
GAUZE SPONGE 4X4 12PLY STRL (GAUZE/BANDAGES/DRESSINGS) ×2 IMPLANT
GAUZE XEROFORM 1X8 LF (GAUZE/BANDAGES/DRESSINGS) ×2 IMPLANT
GLOVE BIO SURGEON STRL SZ7.5 (GLOVE) ×2 IMPLANT
GLOVE BIOGEL PI IND STRL 7.0 (GLOVE) IMPLANT
GLOVE BIOGEL PI IND STRL 8 (GLOVE) ×1 IMPLANT
GLOVE BIOGEL PI IND STRL 8.5 (GLOVE) IMPLANT
GLOVE BIOGEL PI INDICATOR 7.0 (GLOVE) ×2
GLOVE BIOGEL PI INDICATOR 8 (GLOVE) ×1
GLOVE BIOGEL PI INDICATOR 8.5 (GLOVE) ×1
GLOVE ECLIPSE 6.5 STRL STRAW (GLOVE) ×1 IMPLANT
GLOVE SURG ORTHO 8.0 STRL STRW (GLOVE) ×1 IMPLANT
GOWN STRL REUS W/ TWL XL LVL3 (GOWN DISPOSABLE) ×1 IMPLANT
GOWN STRL REUS W/TWL XL LVL3 (GOWN DISPOSABLE) ×5 IMPLANT
K-WIRE 6 .028 (WIRE)
K-WIRE DBL TRONS .035X6 (WIRE) ×2
KWIRE 6 .028 (WIRE) IMPLANT
KWIRE DBL TRONS .035X6 (WIRE) IMPLANT
NDL HYPO 25X1 1.5 SAFETY (NEEDLE) IMPLANT
NEEDLE HYPO 22GX1.5 SAFETY (NEEDLE) IMPLANT
NEEDLE HYPO 25X1 1.5 SAFETY (NEEDLE) IMPLANT
NS IRRIG 1000ML POUR BTL (IV SOLUTION) ×2 IMPLANT
PACK BASIN DAY SURGERY FS (CUSTOM PROCEDURE TRAY) ×2 IMPLANT
PAD CAST 3X4 CTTN HI CHSV (CAST SUPPLIES) IMPLANT
PAD CAST 4YDX4 CTTN HI CHSV (CAST SUPPLIES) IMPLANT
PADDING CAST ABS 4INX4YD NS (CAST SUPPLIES) ×1
PADDING CAST ABS COTTON 4X4 ST (CAST SUPPLIES) ×1 IMPLANT
PADDING CAST COTTON 3X4 STRL (CAST SUPPLIES)
PADDING CAST COTTON 4X4 STRL (CAST SUPPLIES)
PLATE LOCKING 1.5 Y SHAPE (Plate) ×1 IMPLANT
SCREW 1.5X15MM (Screw) ×2 IMPLANT
SCREW 1.5X18MM (Screw) ×2 IMPLANT
SCREW BN 18X1.5XST NONLOCK (Screw) IMPLANT
SCREW NL 1.5X12 (Screw) ×2 IMPLANT
SCREW NL 1.5X13 (Screw) ×2 IMPLANT
SCREW NONIOC 1.5 14M (Screw) ×5 IMPLANT
SCREW NONIOC 1.5 16M (Screw) ×1 IMPLANT
SLEEVE SCD COMPRESS KNEE MED (MISCELLANEOUS) IMPLANT
SLING ARM FOAM STRAP XLG (SOFTGOODS) ×1 IMPLANT
SPLINT PLASTER CAST XFAST 3X15 (CAST SUPPLIES) IMPLANT
SPLINT PLASTER CAST XFAST 4X15 (CAST SUPPLIES) IMPLANT
SPLINT PLASTER XTRA FAST SET 4 (CAST SUPPLIES)
SPLINT PLASTER XTRA FASTSET 3X (CAST SUPPLIES) ×20
STOCKINETTE 4X48 STRL (DRAPES) ×2 IMPLANT
SUT CHROMIC 5 0 RB 1 27 (SUTURE) ×1 IMPLANT
SUT ETHILON 3 0 PS 1 (SUTURE) IMPLANT
SUT ETHILON 4 0 PS 2 18 (SUTURE) ×2 IMPLANT
SUT MERSILENE 4 0 P 3 (SUTURE) ×1 IMPLANT
SUT VIC AB 3-0 PS1 18 (SUTURE)
SUT VIC AB 3-0 PS1 18XBRD (SUTURE) IMPLANT
SUT VICRYL 4-0 PS2 18IN ABS (SUTURE) IMPLANT
SYR BULB 3OZ (MISCELLANEOUS) ×2 IMPLANT
SYR CONTROL 10ML LL (SYRINGE) IMPLANT
TOWEL OR 17X24 6PK STRL BLUE (TOWEL DISPOSABLE) ×3 IMPLANT
TOWEL OR NON WOVEN STRL DISP B (DISPOSABLE) ×1 IMPLANT
UNDERPAD 30X30 (UNDERPADS AND DIAPERS) ×1 IMPLANT

## 2017-08-08 NOTE — Anesthesia Postprocedure Evaluation (Signed)
Anesthesia Post Note  Patient: Kenneth Roth  Procedure(s) Performed: Procedure(s) (LRB): RIGHT LONG FINGER METACARPAL (Right)     Patient location during evaluation: PACU Anesthesia Type: Regional Level of consciousness: awake and alert Pain management: pain level controlled Vital Signs Assessment: post-procedure vital signs reviewed and stable Respiratory status: spontaneous breathing, nonlabored ventilation, respiratory function stable and patient connected to nasal cannula oxygen Cardiovascular status: stable and blood pressure returned to baseline Anesthetic complications: no    Last Vitals:  Vitals:   08/08/17 1530 08/08/17 1550  BP: (!) 139/96 (!) 120/91  Pulse: 76 77  Resp: 19 14  Temp:  (!) 36.4 C  SpO2: 99% 97%    Last Pain:  Vitals:   08/08/17 1550  TempSrc: Oral  PainSc: 0-No pain                 Ryan P Ellender

## 2017-08-08 NOTE — Anesthesia Procedure Notes (Signed)
Anesthesia Regional Block: Axillary brachial plexus block   Pre-Anesthetic Checklist: ,, timeout performed, Correct Patient, Correct Site, Correct Laterality, Correct Procedure,, site marked, risks and benefits discussed, Surgical consent,  Pre-op evaluation,  At surgeon's request and post-op pain management  Laterality: Right  Prep: chloraprep       Needles:  Injection technique: Single-shot  Needle Type: Echogenic Stimulator Needle     Needle Length: 9cm  Needle Gauge: 21     Additional Needles:   Procedures: ultrasound guided,,,,,,,,  Narrative:  Start time: 08/08/2017 1:10 PM End time: 08/08/2017 1:20 PM Injection made incrementally with aspirations every 5 mL.  Performed by: Personally  Anesthesiologist: Karna ChristmasELLENDER, Daxx Tiggs P  Additional Notes: Functioning IV was confirmed and monitors were applied.  A 90mm 21ga Arrow echogenic stimulator needle was used. Sterile prep, hand hygiene and sterile gloves were used.  Negative aspiration and negative test dose prior to incremental administration of local anesthetic. The patient tolerated the procedure well.

## 2017-08-08 NOTE — Op Note (Signed)
NAMECAMIL, Kenneth Roth NO.:  [id number]  MEDICAL RECORD NO.:  [id number]  LOCATION:                                 FACILITY:  PHYSICIAN:  Betha Loa, MD        DATE OF BIRTH:  Jul 08, 1966  DATE OF PROCEDURE:  08/08/2017 DATE OF DISCHARGE:                              OPERATIVE REPORT   PREOPERATIVE DIAGNOSIS:  Right long finger metacarpal shaft fracture.  POSTOPERATIVE DIAGNOSIS:  Right long finger metacarpal shaft fracture.  PROCEDURE:  Open reduction and internal fixation of right long finger metacarpal shaft fracture.  SURGEON:  Betha Loa, MD  ASSISTANT:  Cindee Salt, M.D.  ANESTHESIA:  Regional with sedation.  IV FLUIDS:  Per anesthesia flow sheet.  ESTIMATED BLOOD LOSS:  Minimal.  COMPLICATIONS:  None.  SPECIMENS:  None.  TOURNIQUET TIME:  67 minutes.  DISPOSITION:  Stable to PACU.  INDICATIONS:  Mr. Lipford is a 51 year old male who states he injured his right hand when he fell approximately a week and half ago injuring his right hand.  He was seen in the emergency department where radiographs were taken revealing metacarpal fracture.  He was splinted and followed up in the office.  He wished to proceed with operative fixation.  Risks, benefits, and alternatives of surgery were discussed including the risk of blood loss; infection; damage to nerves, vessels, tendons, ligaments, bone; failure of surgery; need for additional surgery; complications with wound healing; continued pain; nonunion; malunion; stiffness.  He voiced understanding of these risks and elected to proceed.  OPERATIVE COURSE:  After being identified preoperatively by myself, the patient and I agreed upon the procedure and site of procedure.  Surgical site was marked.  The risks, benefits, and alternatives of surgery were reviewed and he wished to proceed.  Surgical consent had been signed. He was given IV Ancef as preoperative antibiotic prophylaxis.  He was transferred  to the operating room and placed on the operating room table in supine position with the right upper extremity on an arm board.  A regional block had been performed by Anesthesia in preoperative holding. The right upper extremity was prepped and draped in a normal sterile orthopedic fashion.  A surgical pause was performed between surgeons, anesthesia, and operating room staff; and all were in agreement as to the patient, procedure, and site of procedure.  Tourniquet at the proximal aspect of the extremity was inflated to 250 mmHg after exsanguination of the limb with an Esmarch bandage.  An incision was made on dorsum of the hand and carried into the subcutaneous tissues by spreading technique.  Bipolar electrocautery was used to obtain hemostasis.  Dorsal sensory branch was identified and protected throughout the case.  The periosteum was sharply incised.  It was elevated from the fracture.  The fracture was identified.  It was cleared of consolidating hematoma.  It was reduced under direct visualization.  There was comminution with a large butterfly fragment. C-arm was used in AP and lateral projections to ensure appropriate reduction, which was the case.  A Y-shaped plate from the AOPS set was selected and secured to the bone.  A provisional K-wire had been  placed for stabilization of the fracture.  The plate was secured with K-wires. Again, the C-arm was used in AP and lateral projections to ensure appropriate reduction and position of hardware, which was the case. Standard AO drilling and measuring technique was used.  All holes in the plate were filled.  Good purchase was obtained.  The wrist was placed through tenodesis, and there was no scissoring.  The long finger lined up with the index finger.  The fracture was reduced well under direct visualization.  C-arm was again used in AP, lateral, and oblique projections to ensure appropriate reduction and position of the hardware,  which was the case.  The wound was copiously irrigated with sterile saline.  The periosteum was closed with a running 5-0 chromic suture.  An extension of the incision into the extensor tendon in a longitudinal fashion distally was repaired with a running 4-0 Mersilene suture.  The skin was closed with 4-0 nylon in a horizontal mattress fashion.  The wound was dressed with sterile Xeroform, 4 x 4's and wrapped with a Kerlix bandage.  A volar and dorsal slab splint including the long, ring, and small fingers was placed with the MPs flexed and the IPs extended.  This was wrapped with Kerlix and Ace bandage.  Tourniquet was deflated at 67 minutes.  Fingertips were pink with brisk capillary refill after deflation of tourniquet.  The operative drapes were broken down, and the patient was awoken from anesthesia safely.  He was transferred back to stretcher and taken to PACU in stable condition.  I will see him back in the office in 1 week for postoperative followup.  I will give him Percocet 5/325 one to two p.o. q.6 hours p.r.n. pain, dispensed #30.     Betha LoaKevin Tyreon Frigon, MD     KK/MEDQ  D:  08/08/2017  T:  08/08/2017  Job:  [id number]

## 2017-08-08 NOTE — Brief Op Note (Signed)
08/08/2017  3:00 PM  PATIENT:  Kenneth Roth  51 y.o. male  PRE-OPERATIVE DIAGNOSIS:  DISPLACED COMMIJUTED FRACTURE RIGHT LONG METACARPAL  POST-OPERATIVE DIAGNOSIS:  DISPLACED COMMIJUTED FRACTURE RIGHT LONG METACARPAL  PROCEDURE:  Procedure(s): RIGHT LONG FINGER METACARPAL (Right)  SURGEON:  Surgeon(s) and Role:    * Betha LoaKuzma, Jazalynn Mireles, MD - Primary    * Cindee SaltKuzma, Gary, MD - Assisting  PHYSICIAN ASSISTANT:   ASSISTANTS: Cindee SaltGary Courtenay Creger, MD   ANESTHESIA:   regional and sedation  EBL:  No intake/output data recorded.  BLOOD ADMINISTERED:none  DRAINS: none   LOCAL MEDICATIONS USED:  NONE  SPECIMEN:  No Specimen  DISPOSITION OF SPECIMEN:  N/A  COUNTS:  YES  TOURNIQUET:   Total Tourniquet Time Documented: Upper Arm (Right) - 67 minutes Total: Upper Arm (Right) - 67 minutes   DICTATION: .Other Dictation: Dictation Number [id number]  PLAN OF CARE: Discharge to home after PACU  PATIENT DISPOSITION:  PACU - hemodynamically stable.

## 2017-08-08 NOTE — Anesthesia Preprocedure Evaluation (Addendum)
Anesthesia Evaluation  Patient identified by MRN, date of birth, ID band Patient awake    Reviewed: Allergy & Precautions, H&P , NPO status , Patient's Chart, lab work & pertinent test results  Airway Mallampati: IV   Neck ROM: full    Dental  (+) Poor Dentition, Missing Periodontal disease:   Pulmonary Current Smoker,    breath sounds clear to auscultation       Cardiovascular negative cardio ROS   Rhythm:regular Rate:Normal  ECG: SR, rate 79   Neuro/Psych H/o polio    GI/Hepatic   Endo/Other  diabetes, Type 2, Oral Hypoglycemic Agents  Renal/GU      Musculoskeletal   Abdominal   Peds  Hematology   Anesthesia Other Findings Hyperlipidemia  Reproductive/Obstetrics                            Anesthesia Physical  Anesthesia Plan  ASA: III  Anesthesia Plan: MAC and Regional   Post-op Pain Management:  Regional for Post-op pain   Induction: Intravenous  PONV Risk Score and Plan: Propofol infusion and Treatment may vary due to age or medical condition  Airway Management Planned: Natural Airway  Additional Equipment:   Intra-op Plan:   Post-operative Plan:   Informed Consent: I have reviewed the patients History and Physical, chart, labs and discussed the procedure including the risks, benefits and alternatives for the proposed anesthesia with the patient or authorized representative who has indicated his/her understanding and acceptance.     Plan Discussed with: CRNA  Anesthesia Plan Comments:       Anesthesia Quick Evaluation

## 2017-08-08 NOTE — Progress Notes (Signed)
Assisted Dr. Ellender with right, ultrasound guided, axillary block. Side rails up, monitors on throughout procedure. See vital signs in flow sheet. Tolerated Procedure well. 

## 2017-08-08 NOTE — H&P (Signed)
  Kenneth Roth is an 51 y.o. male.   Chief Complaint: right metacarpal fracture HPI: 51 yo rhd male states he fell in his kitchen 1.5 weeks ago injuring right hand.  Seen in ED where XR revealed metacarpal fracture.  Splinted and followed up in office.  He wishes to proceed with operative fixation.  Allergies: No Known Allergies  Past Medical History:  Diagnosis Date  . Closed displaced fracture of right femoral neck (HCC) 01/29/2017  . Diabetes mellitus without complication (HCC)   . Hyperlipidemia   . Intra-abdominal hernia 09/04/2007   Qualifier: Diagnosis of  By: Abby Potashenshaw, Heather    . Polio     Past Surgical History:  Procedure Laterality Date  . HERNIA REPAIR    . HIP ARTHROPLASTY Right 01/29/2017   Procedure: ARTHROPLASTY BIPOLAR HIP (HEMIARTHROPLASTY);  Surgeon: Sheral Apleyimothy D Murphy, MD;  Location[facility] OR;  Service: Orthopedics;  Laterality: Right;    Family History: Family History  Problem Relation Age of Onset  . Diabetes Mother   . Diabetes Sister   . Diabetes Brother   . Diabetes Sister   . Colon cancer Neg Hx     Social History:   reports that he has been smoking Cigarettes.  He has been smoking about 1.50 packs per day. He uses smokeless tobacco. He reports that he drinks about 14.4 oz of alcohol per week . He reports that he does not use drugs.  Medications: Medications Prior to Admission  Medication Sig Dispense Refill  . atorvastatin (LIPITOR) 40 MG tablet Take 1 tablet (40 mg total) by mouth daily. 90 tablet 3  . metFORMIN (GLUCOPHAGE-XR) 500 MG 24 hr tablet Take 2 tablets (1,000 mg total) by mouth 2 (two) times daily. 360 tablet 3    No results found for this or any previous visit (from the past 48 hour(s)).  No results found.   A comprehensive review of systems was negative.  Blood pressure 128/86, pulse 95, temperature 98.8 F (37.1 C), temperature source Oral, resp. rate 18, height 5\' 7"  (1.702 m), weight 55.3 kg (122 lb), SpO2 99 %.  General appearance:  alert, cooperative and appears stated age Head: Normocephalic, without obvious abnormality, atraumatic Neck: supple, symmetrical, trachea midline Resp: clear to auscultation bilaterally Cardio: regular rate and rhythm GI: non-tender Extremities: Intact sensation and capillary refill all digits.  +epl/fpl/io.  No wounds.  Pulses: 2+ and symmetric Skin: Skin color, texture, turgor normal. No rashes or lesions Neurologic: Grossly normal Incision/Wound:none  Assessment/Plan Right long finger metacarpal fracture.  Non operative and operative treatment options were discussed with the patient and patient wishes to proceed with operative treatment. Risks, benefits, and alternatives of surgery were discussed and the patient agrees with the plan of care.   Levada Bowersox R 08/08/2017, 12:40 PM

## 2017-08-08 NOTE — Discharge Instructions (Addendum)

## 2017-08-08 NOTE — Op Note (Signed)
I assisted Surgeon(s) and Role:    * Betha LoaKuzma, Kevin, MD - Primary    Cindee Salt* Tamarius Rosenfield, MD - Assisting on the Procedure(s): RIGHT LONG FINGER METACARPAL on 08/08/2017.  I provided assistance on this case as follows: setup, approach, debridement, reduction, stabilization and fixation of the fracture, closure and application of the dressing and splint.  Electronically signed by: Nicki ReaperKUZMA,Kline Bulthuis R, MD Date: 08/08/2017 Time: 3:00 PM

## 2017-08-08 NOTE — Transfer of Care (Signed)
Immediate Anesthesia Transfer of Care Note  Patient: Kenneth Roth  Procedure(s) Performed: Procedure(s): RIGHT LONG FINGER METACARPAL (Right)  Patient Location: PACU  Anesthesia Type:MAC combined with regional for post-op pain  Level of Consciousness: awake, alert  and oriented  Airway & Oxygen Therapy: Patient Spontanous Breathing  Post-op Assessment: Report given to RN and Post -op Vital signs reviewed and stable  Post vital signs: Reviewed and stable  Last Vitals:  Vitals:   08/08/17 1320 08/08/17 1327  BP: 116/82   Pulse: 81 80  Resp: 10 (!) 9  Temp:    SpO2: 99% 99%    Last Pain:  Vitals:   08/08/17 1237  TempSrc: Oral  PainSc: 5          Complications: No apparent anesthesia complications

## 2017-08-08 NOTE — Anesthesia Postprocedure Evaluation (Signed)
Anesthesia Post Note  Patient: Kenneth Roth  Procedure(s) Performed: Procedure(s) (LRB): RIGHT LONG FINGER METACARPAL (Right)     Anesthesia Type: MAC and Regional    Last Vitals:  Vitals:   08/08/17 1530 08/08/17 1550  BP: (!) 139/96 (!) 120/91  Pulse: 76 77  Resp: 19 14  Temp:  (!) 36.4 C  SpO2: 99% 97%    Last Pain:  Vitals:   08/08/17 1550  TempSrc: Oral  PainSc: 0-No pain                 Wilkin Lippy P Dezzie Badilla

## 2017-08-11 ENCOUNTER — Encounter (HOSPITAL_BASED_OUTPATIENT_CLINIC_OR_DEPARTMENT_OTHER): Payer: Self-pay | Admitting: Orthopedic Surgery

## 2017-10-06 ENCOUNTER — Encounter (HOSPITAL_COMMUNITY): Payer: Self-pay | Admitting: Emergency Medicine

## 2017-10-06 ENCOUNTER — Emergency Department (HOSPITAL_COMMUNITY)
Admission: EM | Admit: 2017-10-06 | Discharge: 2017-10-06 | Discharge status: Against Medical Advice | Payer: Medicaid Other | Attending: Emergency Medicine | Admitting: Emergency Medicine

## 2017-10-06 ENCOUNTER — Other Ambulatory Visit: Payer: Self-pay

## 2017-10-06 ENCOUNTER — Emergency Department (HOSPITAL_COMMUNITY): Payer: Medicaid Other

## 2017-10-06 DIAGNOSIS — E119 Type 2 diabetes mellitus without complications: Secondary | ICD-10-CM | POA: Diagnosis not present

## 2017-10-06 DIAGNOSIS — M542 Cervicalgia: Secondary | ICD-10-CM | POA: Diagnosis not present

## 2017-10-06 DIAGNOSIS — Z79899 Other long term (current) drug therapy: Secondary | ICD-10-CM | POA: Insufficient documentation

## 2017-10-06 DIAGNOSIS — Z7984 Long term (current) use of oral hypoglycemic drugs: Secondary | ICD-10-CM | POA: Insufficient documentation

## 2017-10-06 DIAGNOSIS — F17210 Nicotine dependence, cigarettes, uncomplicated: Secondary | ICD-10-CM | POA: Diagnosis not present

## 2017-10-06 NOTE — ED Notes (Signed)
Pt verbalizes "can't stay any longer." Pt made aware risks of leaving AMA worsening condition, disability, and/or death.
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
## 2018-03-05 ENCOUNTER — Encounter: Payer: Self-pay | Admitting: Physician Assistant

## 2018-04-14 IMAGING — CR DG HIP (WITH OR WITHOUT PELVIS) 2-3V*R*
3 series · 3 of 3 positions shown · non-contrast
Comparison: None.

CLINICAL DATA: Right hip pain

EXAM:
DG HIP (WITH OR WITHOUT PELVIS) 2-3V RIGHT

[t hip frog leg right]
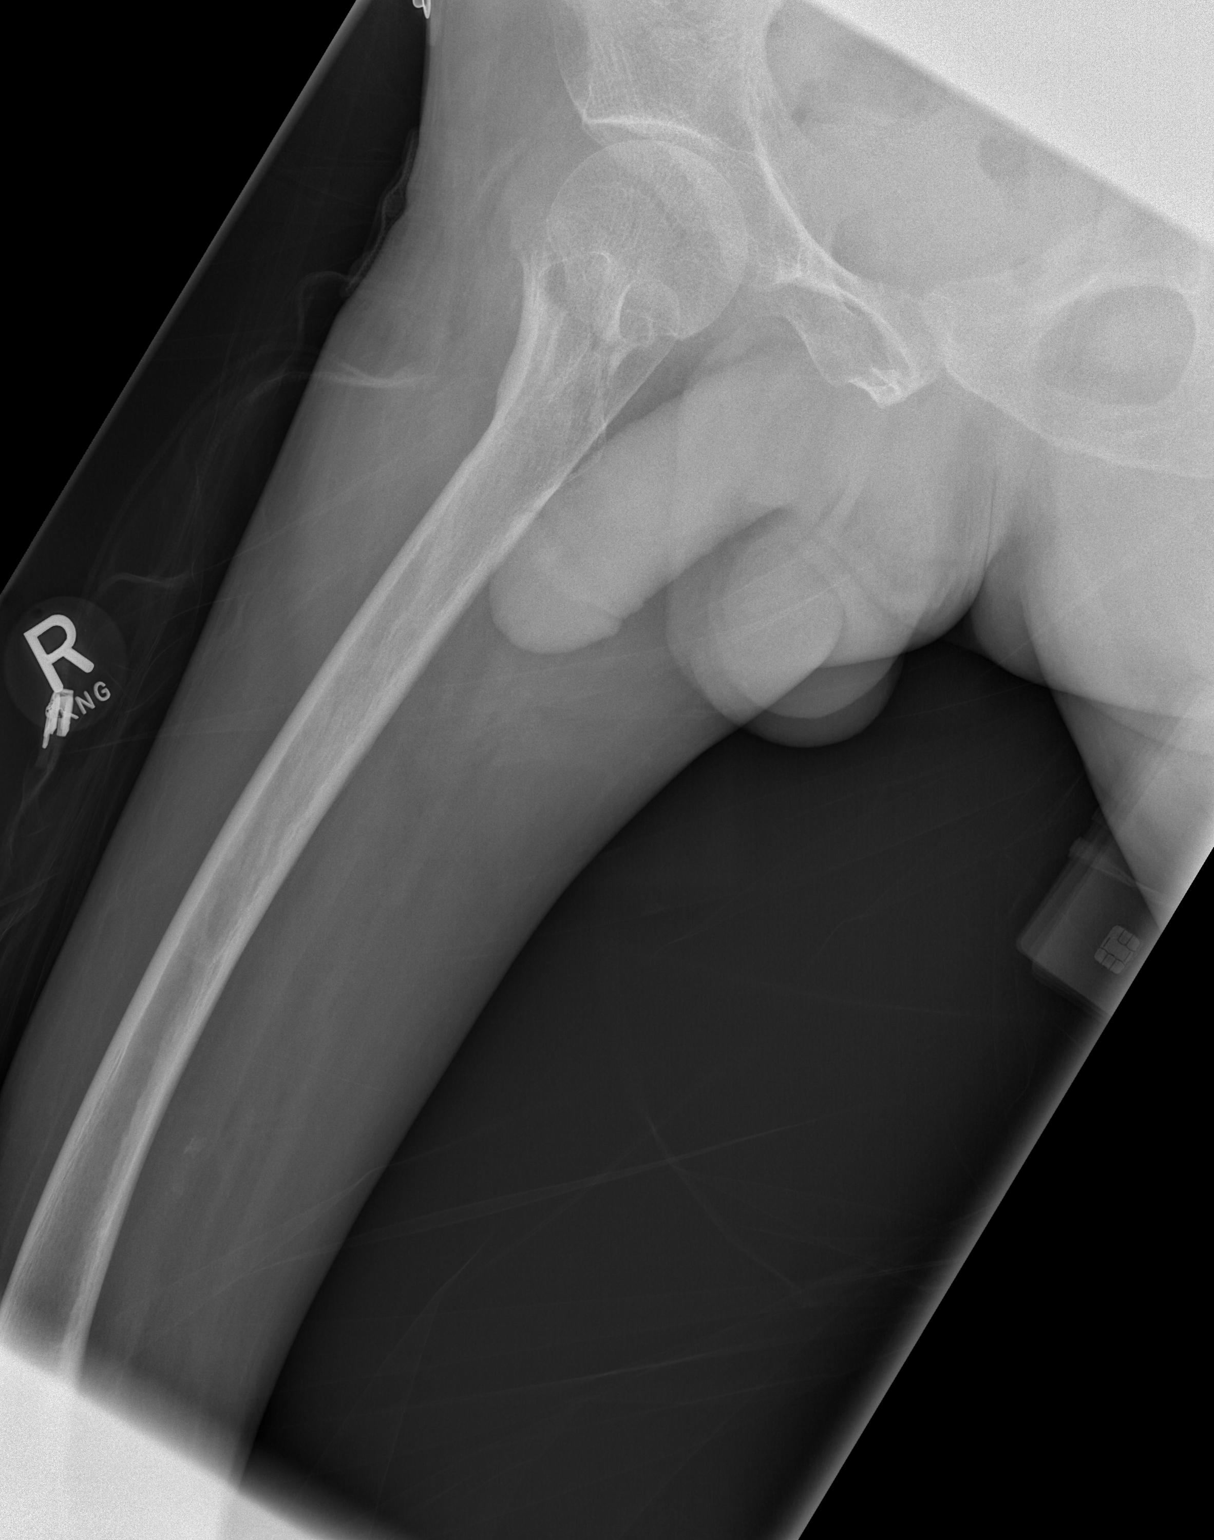

[t hip ap right]
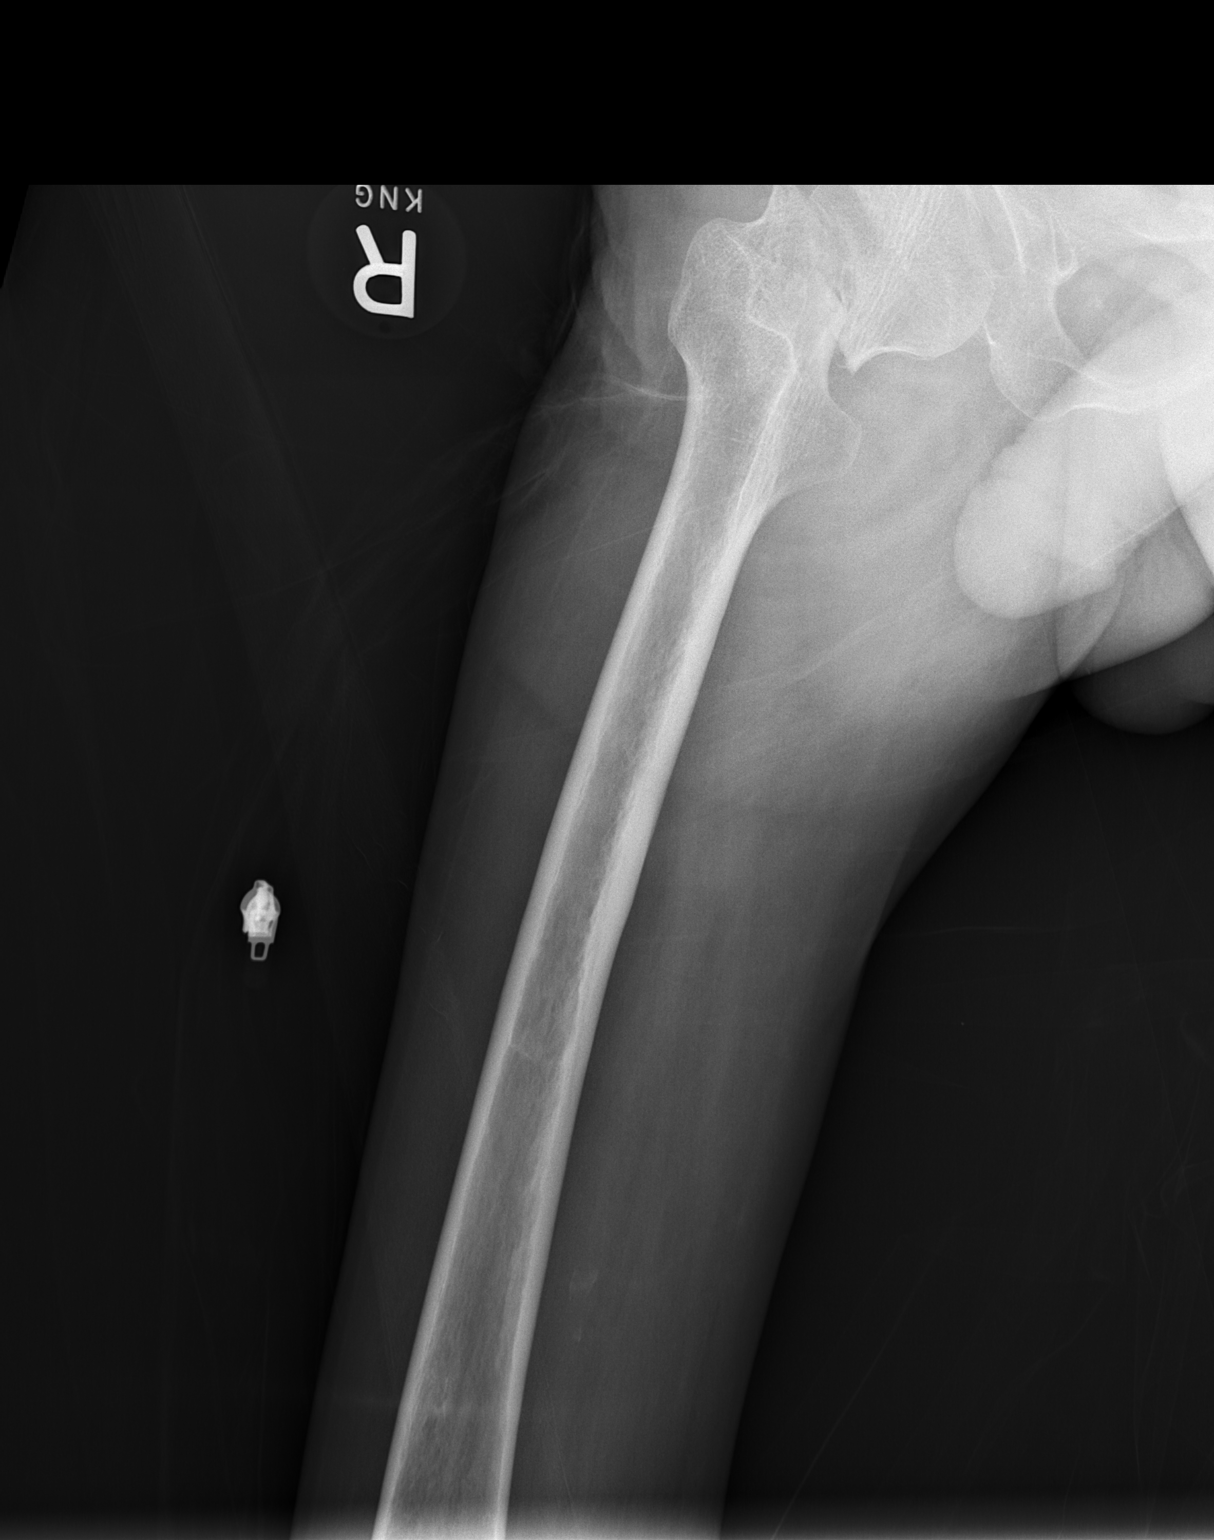

[t pelvis ap]
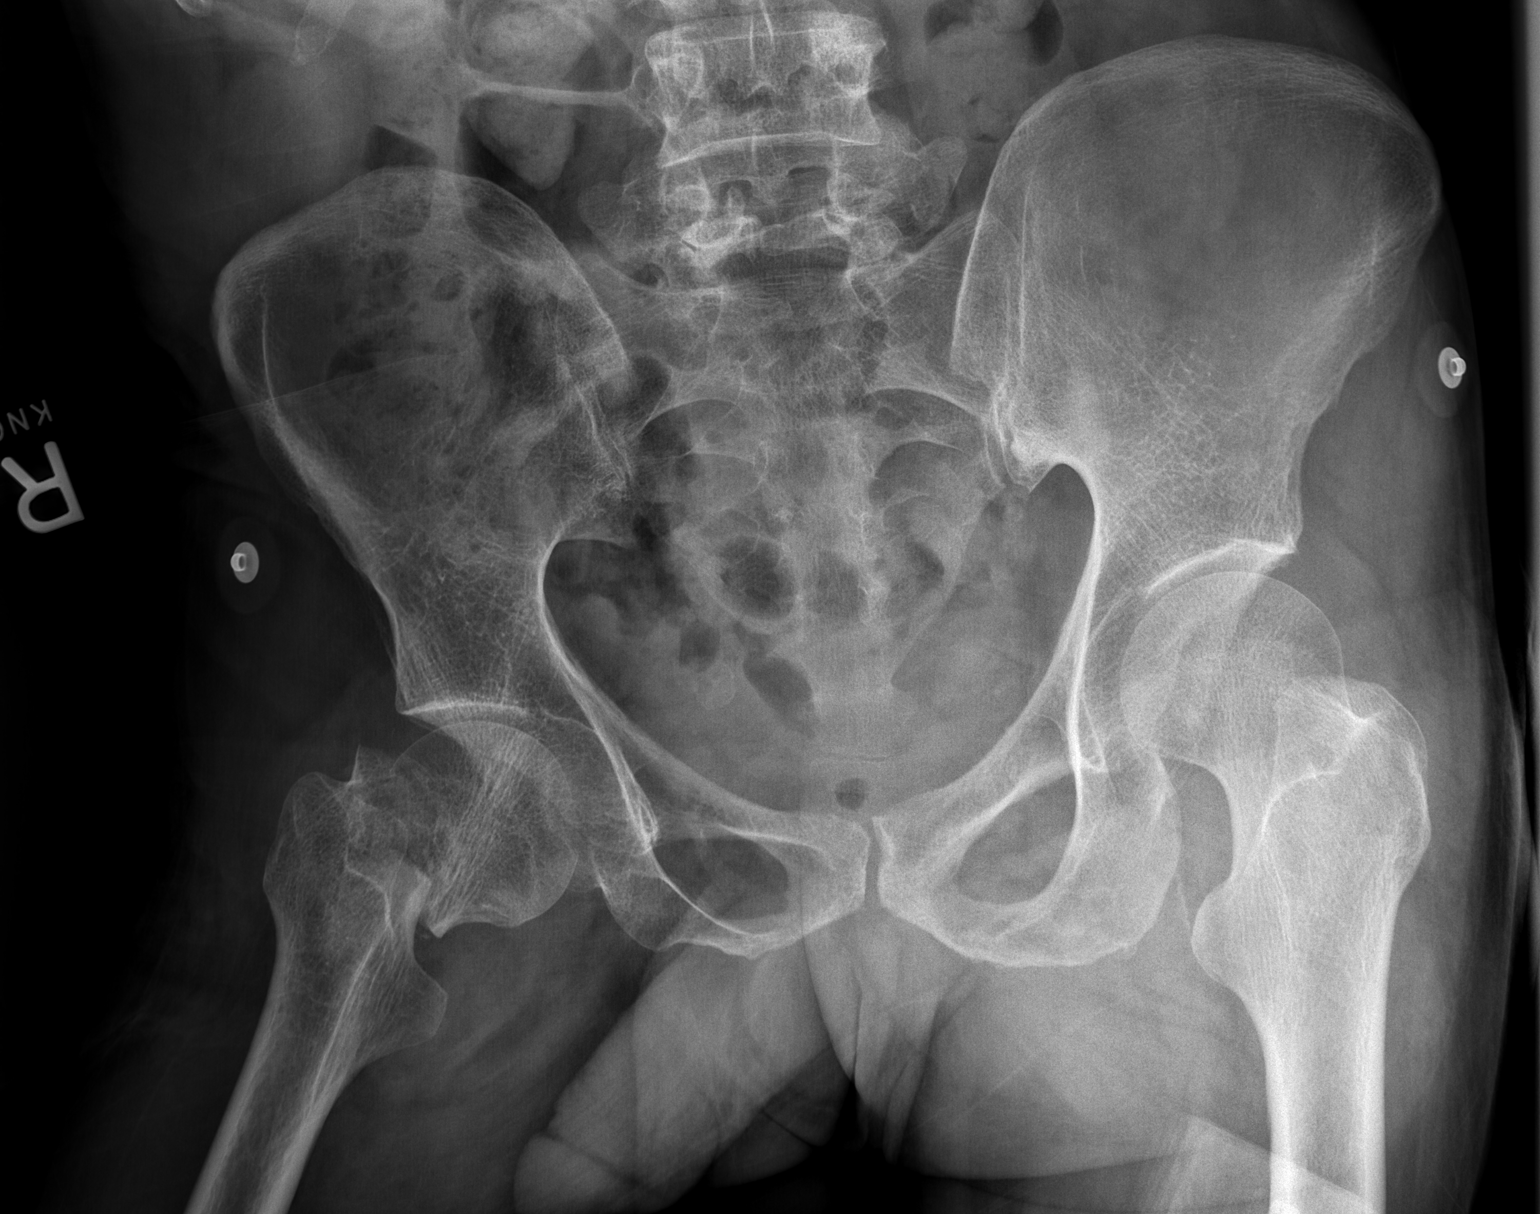

[3 of 3 positions shown; findings below may reference images not displayed]

FINDINGS: There is a superiorly displaced, predominantly transverse fracture
of the right femoral neck. The femoral head remains situated within
the acetabulum. The left hip is normal. No other pelvic fracture or
diastasis.
IMPRESSION: Predominantly transverse, mildly superiorly displaced fracture of
the right femoral neck.

## 2018-04-14 IMAGING — CR DG PORTABLE PELVIS
1 series · 1 of 1 positions shown · non-contrast
Comparison: Earlier films, same date.

CLINICAL DATA: Status post right hip fracture fixation.

EXAM:
PORTABLE PELVIS 1-2 VIEWS

[AP]
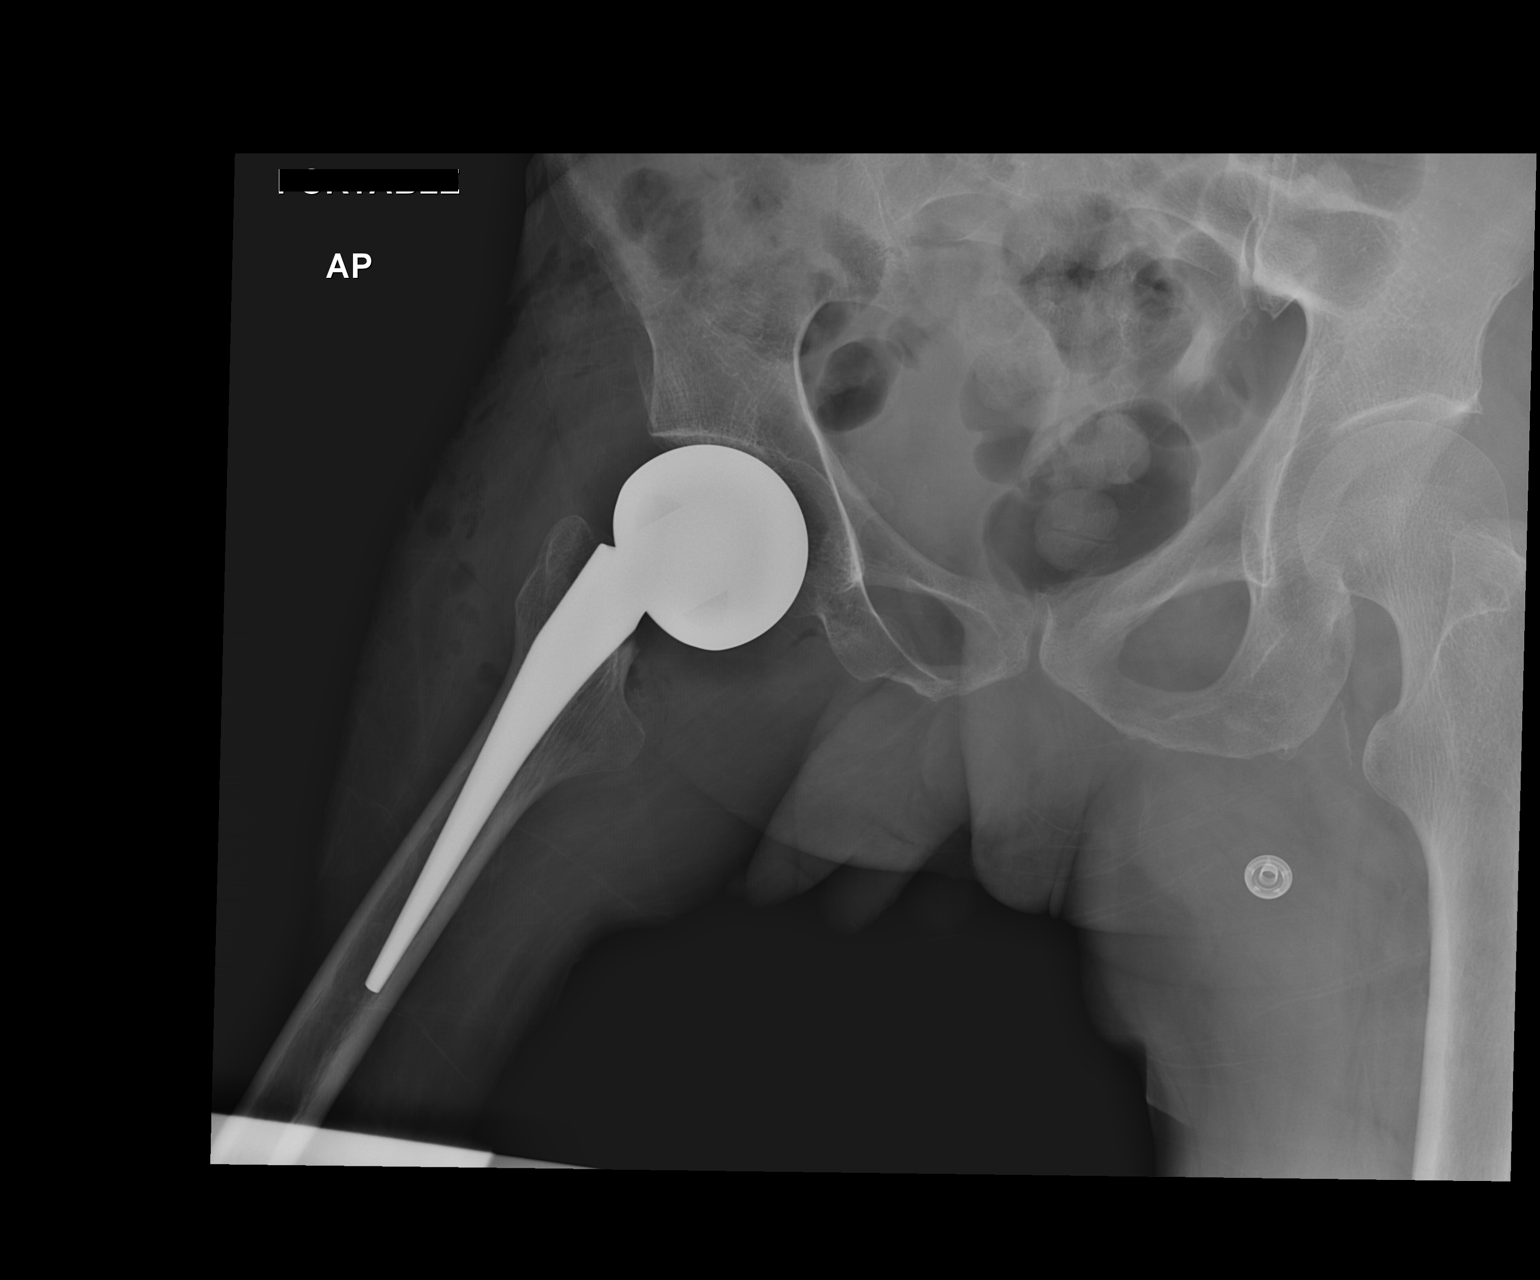

[1 of 1 positions shown; findings below may reference images not displayed]

FINDINGS: There is Francheska Dahan type femoral prosthesis in good position
without complicating features a the bony pelvis is intact.
IMPRESSION: Right hip prosthesis in good position without complicating features.

## 2018-10-14 IMAGING — CR DG HAND COMPLETE 3+V*R*
3 series · 3 of 3 positions shown · non-contrast
Comparison: None.

CLINICAL DATA: Status post fall 3 days ago with right hand pain

EXAM:
RIGHT HAND - COMPLETE 3+ VIEW

[x hand pa right]
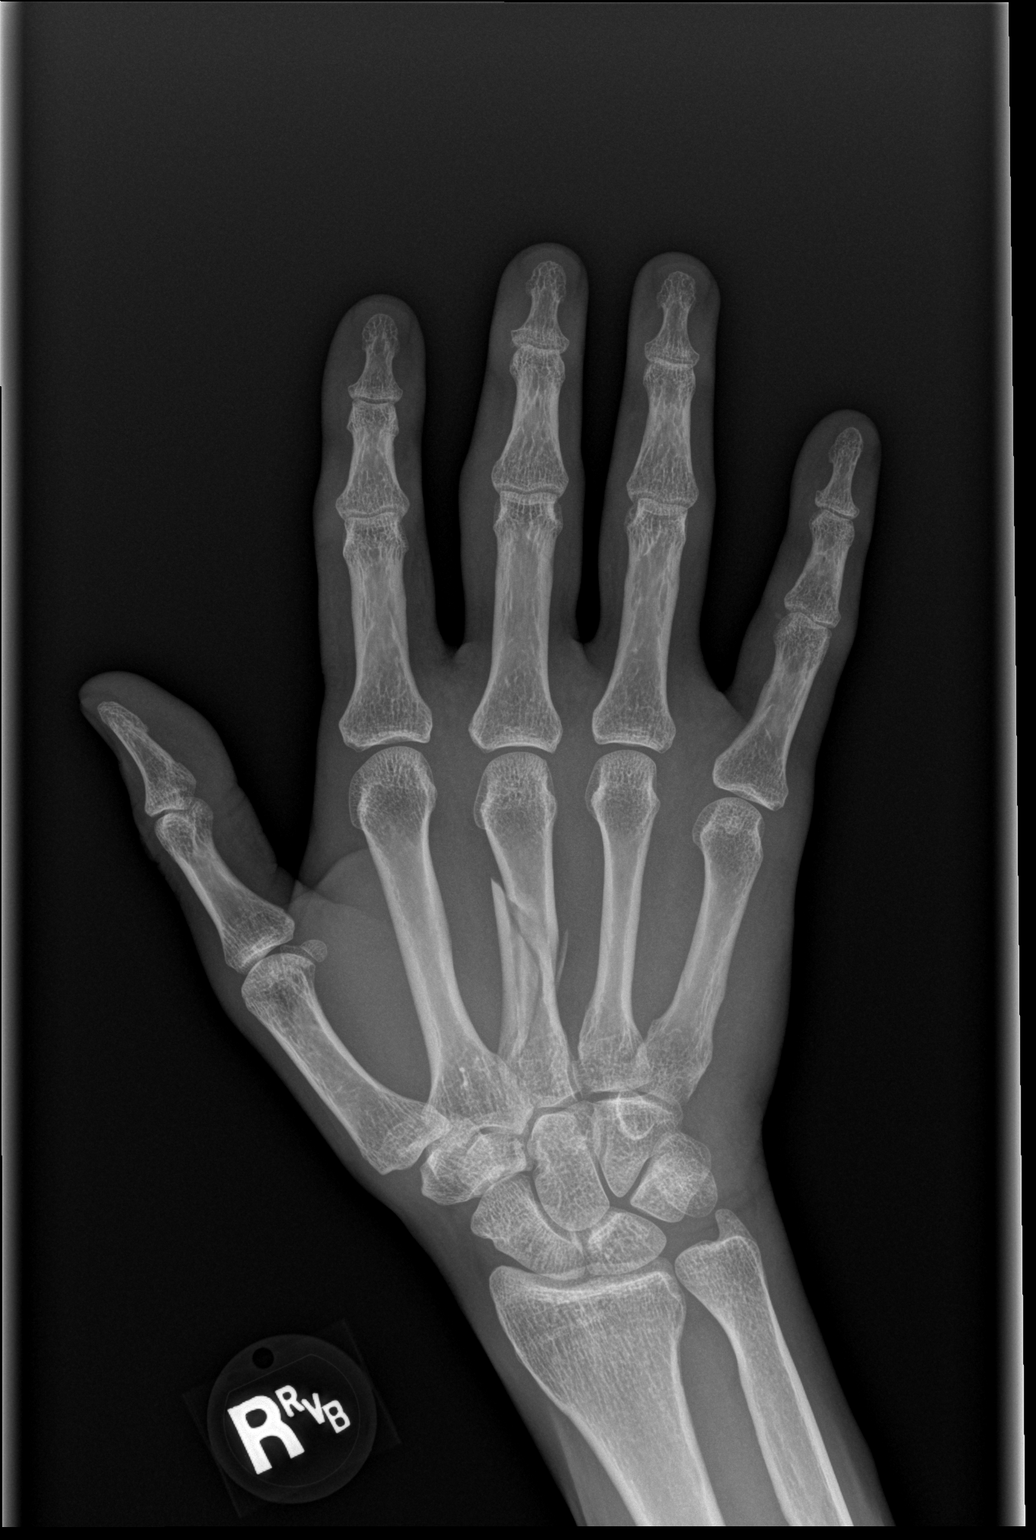

[x hand obl right]
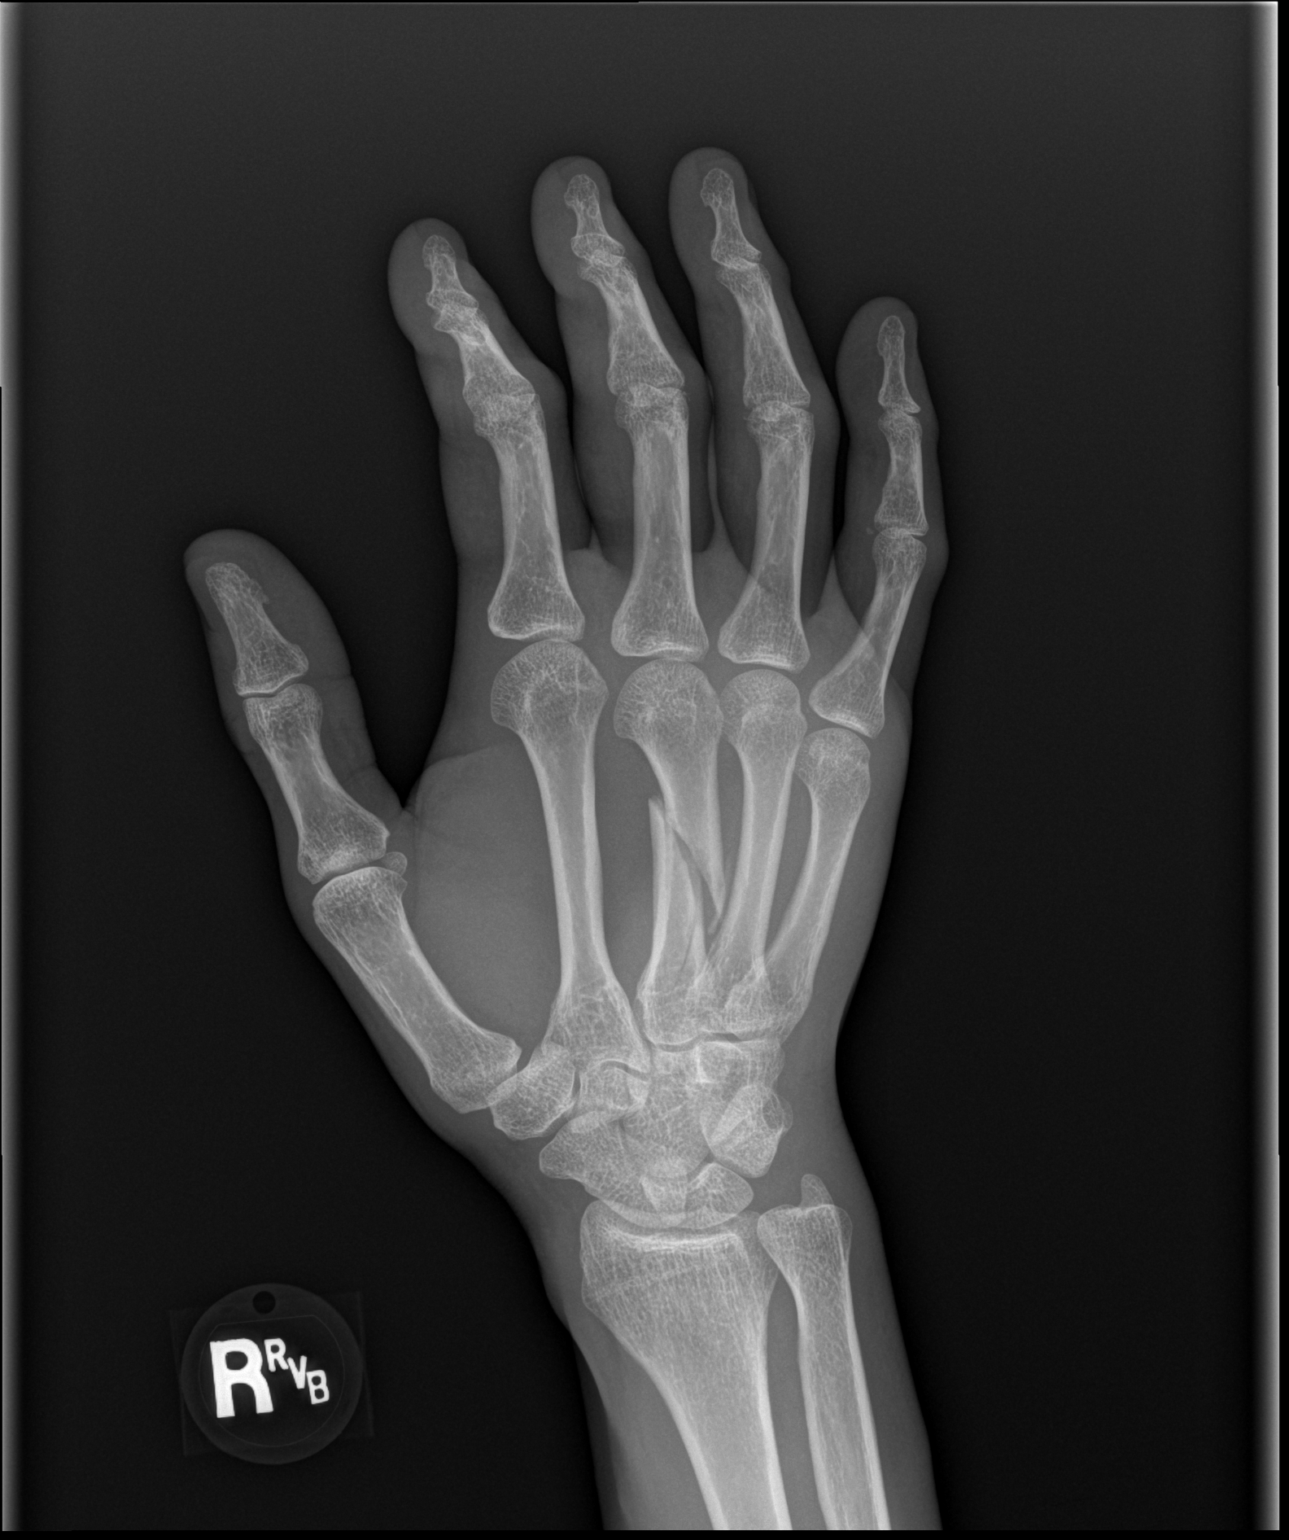

[x hand lat right]
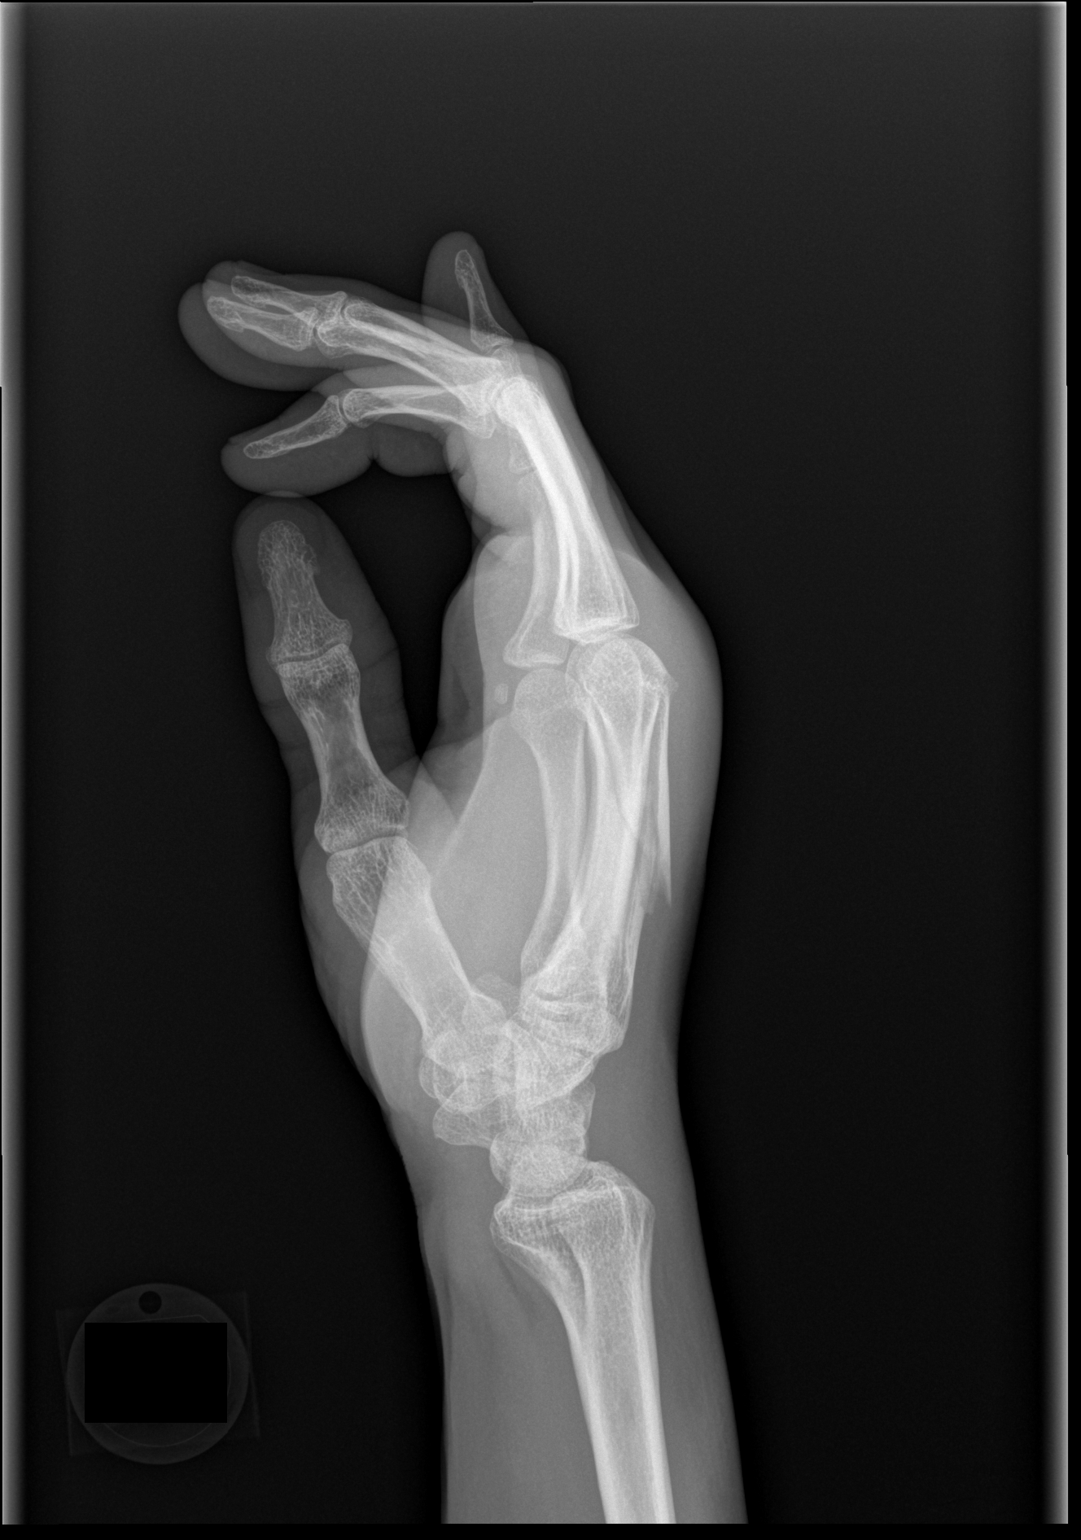

[3 of 3 positions shown; findings below may reference images not displayed]

FINDINGS: There is comminuted displaced fracture of the third mid metacarpal.
There is no dislocation.
IMPRESSION: Fracture third metacarpal.

## 2018-12-27 ENCOUNTER — Ambulatory Visit: Payer: Medicaid Other | Attending: Orthopedic Surgery | Admitting: Physical Therapy

## 2018-12-27 ENCOUNTER — Encounter: Payer: Self-pay | Admitting: Physical Therapy

## 2018-12-27 DIAGNOSIS — M6281 Muscle weakness (generalized): Secondary | ICD-10-CM

## 2018-12-27 DIAGNOSIS — M25562 Pain in left knee: Secondary | ICD-10-CM | POA: Diagnosis not present

## 2018-12-27 DIAGNOSIS — G8929 Other chronic pain: Secondary | ICD-10-CM | POA: Diagnosis present

## 2018-12-27 DIAGNOSIS — R262 Difficulty in walking, not elsewhere classified: Secondary | ICD-10-CM

## 2018-12-27 NOTE — Patient Instructions (Signed)
Access Code: [email or web address]  URL: [email or web address]  Date: 12/27/2018  Prepared by: Ivery QualeBrian Nelson   Exercises  Supine Hamstring Stretch with Strap - 3 reps - 1 sets - 30 hold - 2x daily - 6x weekly  Supine ITB Stretch with Strap - 3 sets - 30 hold - 2x daily - 6x weekly  Supine Active Straight Leg Raise - 10 reps - 1-3 sets - 2x daily - 6x weekly  Sidelying Hip Abduction - 10 reps - 3 sets - 2x daily - 6x weekly  Seated Long Arc Quad - 10 reps - 2-3 sets - 2x daily - 6x weekly  Standing Knee Flexion - 10 reps - 3 sets - 2x daily - 6x weekly
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
## 2019-01-07 ENCOUNTER — Ambulatory Visit: Payer: Medicaid Other | Attending: Orthopedic Surgery | Admitting: Physical Therapy

## 2019-01-07 ENCOUNTER — Encounter: Payer: Self-pay | Admitting: Physical Therapy

## 2019-01-07 DIAGNOSIS — M25562 Pain in left knee: Secondary | ICD-10-CM | POA: Diagnosis present

## 2019-01-07 DIAGNOSIS — R262 Difficulty in walking, not elsewhere classified: Secondary | ICD-10-CM

## 2019-01-07 DIAGNOSIS — M6281 Muscle weakness (generalized): Secondary | ICD-10-CM

## 2019-01-07 DIAGNOSIS — G8929 Other chronic pain: Secondary | ICD-10-CM | POA: Diagnosis present

## 2019-01-07 NOTE — Therapy (Signed)
[facility] [address] [place], [place], [address] Phone: [phone]   Fax:  [phone]  Physical Therapy Treatment  Patient Details  Name: Kenneth Roth MRN: [id number] Date of Birth: June 20, 1966 Referring Provider (PT): Loreta Ave, MD   Encounter Date: 01/07/2019  PT End of Session - 01/07/19 1457    Visit Number  2    Number of Visits  12    Date for PT Re-Evaluation  02/07/19    Authorization Type  MCD so resubmit after 3 visits then 1-2 per week for another 3 weeks    Authorization Time Period  2/7-2/27    Authorization - Visit Number  1    Authorization - Number of Visits  3    PT Start Time  1415    PT Stop Time  1457    PT Time Calculation (min)  42 min    Activity Tolerance  Patient tolerated treatment well    Behavior During Therapy  [facility] for tasks assessed/performed       Past Medical History:  Diagnosis Date  . Closed displaced fracture of right femoral neck (HCC) 01/29/2017  . Diabetes mellitus without complication (HCC)   . Hyperlipidemia   . Intra-abdominal hernia 09/04/2007   Qualifier: Diagnosis of  By: Abby Potash    . Polio     Past Surgical History:  Procedure Laterality Date  . FINGER CLOSED REDUCTION Right 08/08/2017   Procedure: RIGHT LONG FINGER METACARPAL;  Surgeon: Betha Loa, MD;  Location: [facility];  Service: Orthopedics;  Laterality: Right;  . HERNIA REPAIR    . HIP ARTHROPLASTY Right 01/29/2017   Procedure: ARTHROPLASTY BIPOLAR HIP (HEMIARTHROPLASTY);  Surgeon: Sheral Apley, MD;  Location: [facility] OR;  Service: Orthopedics;  Laterality: Right;    There were no vitals filed for this visit.  Subjective Assessment - 01/07/19 1419    Subjective  feels about the same right now.     Currently in Pain?  Yes    Pain Score  8     Pain Location  Leg    Pain Orientation  Left    Pain Descriptors / Indicators  --   painful   Aggravating Factors   walking                        [facility] Adult PT Treatment/Exercise - 01/07/19 0001      Knee/Hip Exercises: Stretches   Passive Hamstring Stretch Limitations  seated EOB    ITB Stretch Limitations  sidelying      Knee/Hip Exercises: Aerobic   Stationary Bike  5 min L1      Knee/Hip Exercises: Sidelying   Hip ABduction  20 reps;Left      Knee/Hip Exercises: Prone   Hip Extension  Left;20 reps      Modalities   Modalities  Ultrasound      Ultrasound   Ultrasound Location  Lt distal ITB     Ultrasound Parameters  8 min .5 w/cm2, pulsed    Ultrasound Goals  Pain      Manual Therapy   Manual Therapy  Soft tissue mobilization    Soft tissue mobilization  roller Rt ITB             PT Education - 01/07/19 1459    Education Details  HEP, anatomy of condition, contribution of gait pattern, bracing & AD options    Person(s) Educated  Patient    Methods  Explanation    Comprehension  Verbalized understanding;Need further instruction       PT Short Term Goals - 12/27/18 2041      PT SHORT TERM GOAL #1   Title  Pt will be independent with initial HEP    Baseline  no HEP until today    Time  3    Period  Weeks    Status  New      PT SHORT TERM GOAL #2   Title  25% reduced pain during ambulation for ability to perform functional tasks more efficiantly    Baseline  8/10    Time  3    Period  Weeks    Status  New      PT SHORT TERM GOAL #3   Title  Be able to stand at least 10 minutes for ADL's.     Baseline  5 min    Time  3    Period  Weeks    Status  New        PT Long Term Goals - 12/27/18 2045      PT LONG TERM GOAL #1   Title  Be albe to walk at least 400 ft with SPC and less than 3-4/10 pain.     Baseline  8/10    Time  8    Period  Weeks    Status  New      PT LONG TERM GOAL #2   Title  Pt will report 50% reduction in pain or less than 4/10 pain with ususal activites and stairs.     Baseline  8/10 pain    Time  8    Period  Weeks    Status   New      PT LONG TERM GOAL #3   Title  independent with advanced HEP    Baseline  no HEP until today    Time  8    Period  Weeks    Status  New      PT LONG TERM GOAL #4   Title  Increase Lt LE strength to at least 4+/5 MMT to improve function    Baseline  4/5 MMT    Time  8    Period  Weeks    Status  New            Plan - 01/07/19 1451    Clinical Impression Statement  Significant edema noted at bursa under distal ITB. Difficulty moving leg against gravity. Would benefit from off-loading brace due to Lt leg holding body weight and shifting away from Rt LE. He liked the bike today and is planning to find one to purchase. Discussed use of walker at home to decrease some poor input but pt does not want to use outside of his home.     PT Treatment/Interventions  Electrical Stimulation;Cryotherapy;Iontophoresis 4mg /ml Dexamethasone;Moist Heat;Ultrasound;Gait training;Stair training;Therapeutic activities;Therapeutic exercise;Balance training;Neuromuscular re-education;Manual techniques;Passive range of motion;Dry needling;Energy conservation;Taping;Vasopneumatic Device    PT Next Visit Plan  outcome of [place]? consider ionto, taping    PT Home Exercise Plan  sidelying ITB, seated HSS SL hip abd,     Consulted and Agree with Plan of Care  Patient       Patient will benefit from skilled therapeutic intervention in order to improve the following deficits and impairments:  Abnormal gait, Decreased activity tolerance, Decreased endurance, Decreased range of motion, Decreased strength, Difficulty walking, Impaired flexibility, Increased muscle spasms, Increased fascial restricitons, Postural dysfunction, Pain  Visit  Diagnosis: Chronic pain of left knee  Muscle weakness (generalized)  Difficulty in walking, not elsewhere classified     Problem List Patient Active Problem List   Diagnosis Date Noted  . Smoker 01/30/2017  . History of alcohol abuse 01/30/2017  . Leukocytosis  01/29/2017  . Weight loss 01/29/2017  . History of poliomyelitis 08/22/2016  . Hyperlipidemia 08/22/2016  . Type 2 diabetes mellitus without complication, without long-term current use of insulin (HCC) 08/22/2016  . 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 Jerene Yeager PT, DPT 01/07/19 3:00 PM   [facility] [address] [place], [place], [address] Phone: [phone]   Fax:  [phone]  Name: Kenneth Roth MRN: [id number] Date of Birth: 02/16/1966

## 2019-01-14 ENCOUNTER — Ambulatory Visit: Payer: Medicaid Other | Admitting: Physical Therapy

## 2019-01-14 ENCOUNTER — Encounter: Payer: Self-pay | Admitting: Physical Therapy

## 2019-01-14 DIAGNOSIS — M25562 Pain in left knee: Secondary | ICD-10-CM | POA: Diagnosis not present

## 2019-01-14 DIAGNOSIS — G8929 Other chronic pain: Secondary | ICD-10-CM

## 2019-01-14 DIAGNOSIS — R262 Difficulty in walking, not elsewhere classified: Secondary | ICD-10-CM

## 2019-01-14 DIAGNOSIS — M6281 Muscle weakness (generalized): Secondary | ICD-10-CM
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
## 2019-01-21 ENCOUNTER — Encounter: Payer: Self-pay | Admitting: Physical Therapy

## 2019-01-21 ENCOUNTER — Ambulatory Visit: Payer: Medicaid Other | Admitting: Physical Therapy

## 2019-01-21 DIAGNOSIS — M25562 Pain in left knee: Secondary | ICD-10-CM | POA: Diagnosis not present

## 2019-01-21 DIAGNOSIS — R262 Difficulty in walking, not elsewhere classified: Secondary | ICD-10-CM

## 2019-01-21 DIAGNOSIS — M6281 Muscle weakness (generalized): Secondary | ICD-10-CM

## 2019-01-21 DIAGNOSIS — G8929 Other chronic pain: Secondary | ICD-10-CM

## 2019-01-21 NOTE — Therapy (Signed)
[place], [place], [address] Phone: [phone]   Fax:  [phone]  Physical Therapy Treatment/Discharge  Patient Details  Name: Kenneth Roth MRN: [id number] Date of Birth: 10/11/1966 Referring Provider (PT): Edmonia Lynch, MD   Encounter Date: 01/21/2019  PT End of Session - 01/21/19 1444    Visit Number  4    Number of Visits  12    Date for PT Re-Evaluation  02/07/19    Authorization Time Period  2/7-2/27    Authorization - Visit Number  3    Authorization - Number of Visits  3    PT Start Time  4818    PT Stop Time  1445    PT Time Calculation (min)  29 min    Activity Tolerance  Patient tolerated treatment well    Behavior During Therapy  [facility] for tasks assessed/performed       Past Medical History:  Diagnosis Date  . Closed displaced fracture of right femoral neck ([place]) 01/29/2017  . Diabetes mellitus without complication ([place])   . Hyperlipidemia   . Intra-abdominal hernia 09/04/2007   Qualifier: Diagnosis of  By: Enrique Sack    . Polio     Past Surgical History:  Procedure Laterality Date  . FINGER CLOSED REDUCTION Right 08/08/2017   Procedure: RIGHT LONG FINGER METACARPAL;  Surgeon: Leanora Cover, MD;  Location: [place];  Service: Orthopedics;  Laterality: Right;  . HERNIA REPAIR    . HIP ARTHROPLASTY Right 01/29/2017   Procedure: ARTHROPLASTY BIPOLAR HIP (HEMIARTHROPLASTY);  Surgeon: Renette Butters, MD;  Location: [facility];  Service: Orthopedics;  Laterality: Right;    There were no vitals filed for this visit.  Subjective Assessment - 01/21/19 1420    Subjective  sore for the last two days, I think the shot has worn off.     Diagnostic tests  pt relays MRI showed IT band syndrome    Patient Stated Goals  get the pain down    Currently in Pain?  Yes    Pain Score  8     Pain Location  Knee    Pain Orientation  Left;Lateral    Pain Descriptors / Indicators   Stabbing;Sharp    Aggravating Factors   walking/standing    Pain Relieving Factors  rest, shots         [facility] PT Assessment - 01/21/19 0001      Assessment   Medical Diagnosis  Lt Iliotibial Band Syndrome    Referring Provider (PT)  Edmonia Lynch, MD      Strength   Overall Strength Comments  Lt LE hip/knee 4/5 MMT grossly tested in sitting but limited by pain and spasm      Palpation   Palpation comment  TTP in IT band and H.S, denies pain in anterior knee      Special Tests   Other special tests  +OBERS test                           PT Education - 01/21/19 1720    Education Details  POC, bracing/gait pattern    Person(s) Educated  Patient    Methods  Explanation    Comprehension  Verbalized understanding       PT Short Term Goals - 01/21/19 1421      PT SHORT TERM GOAL #1   Title  Pt will be independent with initial HEP  Status  Achieved      PT SHORT TERM GOAL #2   Title  25% reduced pain during ambulation for ability to perform functional tasks more efficiantly    Baseline  8/10 still today    Status  Not Met      PT SHORT TERM GOAL #3   Title  Be able to stand at least 10 minutes for ADL's.         PT Long Term Goals - 01/21/19 1719      PT LONG TERM GOAL #1   Title  Be albe to walk at least 400 ft with SPC and less than 3-4/10 pain.     Baseline  8/10    Status  Not Met      PT LONG TERM GOAL #2   Title  Pt will report 50% reduction in pain or less than 4/10 pain with ususal activites and stairs.     Baseline  8/10 pain    Status  Not Met      PT LONG TERM GOAL #3   Title  independent with advanced HEP    Status  Not Met      PT LONG TERM GOAL #4   Title  Increase Lt LE strength to at least 4+/5 MMT to improve function    Baseline  4/5 MMT    Status  Not Met            Plan - 01/21/19 1533    Clinical Impression Statement  At this time pt does not believe that PT is helpful to his left knee. I agree that PT is  limited in treatment of bursitis on Left knee due to gait pattern compensating for Rt leg. [facility] clinic rep Gerald Stabs) evaluated for bracing and MD was contacted regarding referral. Will d/c at this time and pt will continue with [facility] and return to PT at a later date if we are needed.     PT Treatment/Interventions  Electrical Stimulation;Cryotherapy;Iontophoresis 71m/ml Dexamethasone;Moist Heat;Ultrasound;Gait training;Stair training;Therapeutic activities;Therapeutic exercise;Balance training;Neuromuscular re-education;Manual techniques;Passive range of motion;Dry needling;Energy conservation;Taping;Vasopneumatic Device    PT Home Exercise Plan  sidelying ITB, seated HSS SL hip abd, clam, SLS on Lt, prone HS curls    Consulted and Agree with Plan of Care  Patient       Patient will benefit from skilled therapeutic intervention in order to improve the following deficits and impairments:  Abnormal gait, Decreased activity tolerance, Decreased endurance, Decreased range of motion, Decreased strength, Difficulty walking, Impaired flexibility, Increased muscle spasms, Increased fascial restricitons, Postural dysfunction, Pain  Visit Diagnosis: Chronic pain of left knee  Muscle weakness (generalized)  Difficulty in walking, not elsewhere classified     Problem List Patient Active Problem List   Diagnosis Date Noted  . Smoker 01/30/2017  . History of alcohol abuse 01/30/2017  . Leukocytosis 01/29/2017  . Weight loss 01/29/2017  . History of poliomyelitis 08/22/2016  . Hyperlipidemia 08/22/2016  . Type 2 diabetes mellitus without complication, without long-term current use of insulin ([facility] 08/22/2016  . 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  Patient goals were met. Patient is being discharged due to the patient's request.  ?????      JJanett Billow  Margie Billet PT, DPT 01/21/19 5:20 PM   [facility] [address] [place], [place], [address] Phone: [phone]   Fax:  [phone]  Name: Kenneth Roth MRN: [id number] Date of Birth: 03-25-1966

## 2019-12-22 ENCOUNTER — Emergency Department (HOSPITAL_COMMUNITY): Payer: Medicaid Other

## 2019-12-22 ENCOUNTER — Other Ambulatory Visit: Payer: Self-pay

## 2019-12-22 ENCOUNTER — Emergency Department (HOSPITAL_COMMUNITY)
Admission: EM | Admit: 2019-12-22 | Discharge: 2019-12-22 | Disposition: A | Discharge status: Home / Self Care | Payer: Medicaid Other | Attending: Emergency Medicine | Admitting: Emergency Medicine

## 2019-12-22 ENCOUNTER — Encounter (HOSPITAL_COMMUNITY): Payer: Self-pay | Admitting: Emergency Medicine

## 2019-12-22 DIAGNOSIS — Y939 Activity, unspecified: Secondary | ICD-10-CM | POA: Insufficient documentation

## 2019-12-22 DIAGNOSIS — Y929 Unspecified place or not applicable: Secondary | ICD-10-CM | POA: Insufficient documentation

## 2019-12-22 DIAGNOSIS — F17210 Nicotine dependence, cigarettes, uncomplicated: Secondary | ICD-10-CM | POA: Insufficient documentation

## 2019-12-22 DIAGNOSIS — W010XXA Fall on same level from slipping, tripping and stumbling without subsequent striking against object, initial encounter: Secondary | ICD-10-CM | POA: Insufficient documentation

## 2019-12-22 DIAGNOSIS — S9031XA Contusion of right foot, initial encounter: Secondary | ICD-10-CM | POA: Insufficient documentation

## 2019-12-22 DIAGNOSIS — E119 Type 2 diabetes mellitus without complications: Secondary | ICD-10-CM | POA: Insufficient documentation

## 2019-12-22 DIAGNOSIS — S99921A Unspecified injury of right foot, initial encounter: Secondary | ICD-10-CM | POA: Diagnosis present

## 2019-12-22 DIAGNOSIS — Y999 Unspecified external cause status: Secondary | ICD-10-CM | POA: Insufficient documentation

## 2019-12-22 MED ORDER — OXYCODONE-ACETAMINOPHEN 5-325 MG PO TABS
1.0000 | ORAL_TABLET | Freq: Once | ORAL | Status: AC
  Administered 2019-12-22: 07:00:00 1 via ORAL
  Filled 2019-12-22: qty 1

## 2019-12-22 NOTE — ED Provider Notes (Signed)
[facility] EMERGENCY DEPARTMENT Provider Note   CSN: [id number] Arrival date & time: 12/22/19  0539     History Chief Complaint  Patient presents with  . Foot Pain    Kenneth Roth is a 54 y.o. male.  Patient presents to the emergency department for evaluation of foot injury.  Patient reports that he lost his balance earlier tonight and fell.  Reports pain in his right foot since the fall.  He did not hit his head or lose consciousness.  Patient reports only pain in the foot, no other injuries.        Past Medical History:  Diagnosis Date  . Closed displaced fracture of right femoral neck ([facility]) 01/29/2017  . Diabetes mellitus without complication ([facility])   . Hyperlipidemia   . Intra-abdominal hernia 09/04/2007   Qualifier: Diagnosis of  By: Abby Potash    . Polio     Patient Active Problem List   Diagnosis Date Noted  . Smoker 01/30/2017  . History of alcohol abuse 01/30/2017  . Leukocytosis 01/29/2017  . Weight loss 01/29/2017  . History of poliomyelitis 08/22/2016  . Hyperlipidemia 08/22/2016  . Type 2 diabetes mellitus without complication, without long-term current use of insulin ([facility]) 08/22/2016  . 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 FINGER CLOSED REDUCTION Right 08/08/2017   Procedure: RIGHT LONG FINGER METACARPAL;  Surgeon: Betha Loa, MD;  Location: [facility];  Service: Orthopedics;  Laterality: Right;  . HERNIA REPAIR    . HIP ARTHROPLASTY Right 01/29/2017   Procedure: ARTHROPLASTY BIPOLAR HIP (HEMIARTHROPLASTY);  Surgeon: Sheral Apley, MD;  Location: [facility];  Service: Orthopedics;  Laterality: Right;       Family History  Problem Relation Age of Onset  . Diabetes Mother   . Diabetes Sister   . Diabetes Brother   . Diabetes Sister   . Colon cancer Neg Hx     Social History   Tobacco Use  . Smoking status: Current Every Day Smoker    Packs/day:  1.50    Types: Cigarettes  . Smokeless tobacco: Current User  Substance Use Topics  . Alcohol use: Yes    Alcohol/week: 24.0 standard drinks    Types: 24 Shots of liquor per week    Comment: 4 shots a day per pt. not on Sundays  . Drug use: No    Home Medications Prior to Admission medications   Medication Sig Start Date End Date Taking? Authorizing Provider  atorvastatin (LIPITOR) 40 MG tablet Take 1 tablet (40 mg total) by mouth daily. 04/06/17   Porfirio Oar, PA  metFORMIN (GLUCOPHAGE-XR) 500 MG 24 hr tablet Take 2 tablets (1,000 mg total) by mouth 2 (two) times daily. 04/06/17   Porfirio Oar, PA  oxyCODONE-acetaminophen (PERCOCET) 5-325 MG tablet 1-2 tabs PO q6 hours prn pain 08/08/17   Betha Loa, MD    Allergies    Patient has no known allergies.  Review of Systems   Review of Systems  Musculoskeletal: Positive for arthralgias.  Neurological: Negative for syncope and headaches.  All other systems reviewed and are negative.   Physical Exam Updated Vital Signs BP 137/89   Pulse (!) 105   Temp 97.9 F (36.6 C) (Oral)   Resp 16   SpO2 96%   Physical Exam Vitals and nursing note reviewed.  Constitutional:      General: He is not in acute distress.    Appearance: Normal  appearance. He is well-developed.  HENT:     Head: Normocephalic and atraumatic.     Right Ear: Hearing normal.     Left Ear: Hearing normal.     Nose: Nose normal.  Eyes:     Conjunctiva/sclera: Conjunctivae normal.     Pupils: Pupils are equal, round, and reactive to light.  Cardiovascular:     Rate and Rhythm: Regular rhythm.     Heart sounds: S1 normal and S2 normal. No murmur. No friction rub. No gallop.   Pulmonary:     Effort: Pulmonary effort is normal. No respiratory distress.     Breath sounds: Normal breath sounds.  Chest:     Chest wall: No tenderness.  Abdominal:     General: Bowel sounds are normal.     Palpations: Abdomen is soft.     Tenderness: There is no abdominal  tenderness. There is no guarding or rebound. Negative signs include Murphy's sign and McBurney's sign.     Hernia: No hernia is present.  Musculoskeletal:        General: Normal range of motion.     Cervical back: Normal range of motion and neck supple.     Right hip: Normal.     Right upper leg: Normal.     Right knee: Normal.     Right lower leg: Normal.     Right ankle: Normal.     Right foot: Swelling and tenderness present.  Skin:    General: Skin is warm and dry.     Findings: No rash.  Neurological:     Mental Status: He is alert and oriented to person, place, and time.     GCS: GCS eye subscore is 4. GCS verbal subscore is 5. GCS motor subscore is 6.     Cranial Nerves: No cranial nerve deficit.     Sensory: No sensory deficit.     Coordination: Coordination normal.  Psychiatric:        Speech: Speech normal.        Behavior: Behavior normal.        Thought Content: Thought content normal.     ED Results / Procedures / Treatments   Labs (all labs ordered are listed, but only abnormal results are displayed) Labs Reviewed - No data to display  EKG None  Radiology No results found.  Procedures Procedures (including critical care time)  Medications Ordered in ED Medications  oxyCODONE-acetaminophen (PERCOCET/ROXICET) 5-325 MG per tablet 1 tablet (1 tablet Oral Given 12/22/19 7989)    ED Course  I have reviewed the triage vital signs and the nursing notes.  Pertinent labs & imaging results that were available during my care of the patient were reviewed by me and considered in my medical decision making (see chart for details).    MDM Rules/Calculators/A&P                      Patient presents to the emergency department for evaluation of right foot pain.  Patient had a fall tonight.  He does have some difficulty getting around because he has a history of polio.  He normally uses a cane or a walker.  He lost his balance tonight which caused the fall.  He has  isolated foot pain without any other injury.  X-ray is negative.  Final Clinical Impression(s) / ED Diagnoses Final diagnoses:  Contusion of right foot, initial encounter    Rx / DC Orders ED Discharge Orders  None       Gilda Crease, MD 12/29/19 [phone]

## 2019-12-22 NOTE — ED Notes (Signed)
Patient verbalizes understanding of discharge instructions . Opportunity for questions and answers were provided . Armband removed by staff ,Pt discharged from ED. W/C  offered at D/C  and Declined W/C at D/C and was escorted to lobby by RN.  

## 2019-12-22 NOTE — ED Triage Notes (Signed)
Pt reports falling around 2 am when he lost his balance, denies hitting head or LOC, pain to R foot with swelling present.

## 2020-09-16 ENCOUNTER — Other Ambulatory Visit: Payer: Self-pay

## 2020-09-16 ENCOUNTER — Encounter (HOSPITAL_COMMUNITY): Payer: Self-pay

## 2020-09-16 ENCOUNTER — Emergency Department (HOSPITAL_COMMUNITY)
Admission: EM | Admit: 2020-09-16 | Discharge: 2020-09-16 | Disposition: A | Discharge status: Home / Self Care | Payer: Medicaid Other | Attending: Emergency Medicine | Admitting: Emergency Medicine

## 2020-09-16 DIAGNOSIS — R109 Unspecified abdominal pain: Secondary | ICD-10-CM | POA: Diagnosis present

## 2020-09-16 DIAGNOSIS — Z5321 Procedure and treatment not carried out due to patient leaving prior to being seen by health care provider: Secondary | ICD-10-CM | POA: Diagnosis not present

## 2020-09-16 DIAGNOSIS — R110 Nausea: Secondary | ICD-10-CM | POA: Diagnosis not present

## 2020-09-16 LAB — COMPREHENSIVE METABOLIC PANEL
ALT: 100 U/L — ABNORMAL HIGH (ref 0–44)
AST: 80 U/L — ABNORMAL HIGH (ref 15–41)
Albumin: 4.1 g/dL (ref 3.5–5.0)
Alkaline Phosphatase: 115 U/L (ref 38–126)
Anion gap: 13 (ref 5–15)
BUN: 8 mg/dL (ref 6–20)
CO2: 27 mmol/L (ref 22–32)
Calcium: 9.4 mg/dL (ref 8.9–10.3)
Chloride: 98 mmol/L (ref 98–111)
Creatinine, Ser: 0.67 mg/dL (ref 0.61–1.24)
GFR, Estimated: >60 mL/min (ref >60)
Glucose, Bld: 209 mg/dL — ABNORMAL HIGH (ref 70–99)
Potassium: 4.1 mmol/L (ref 3.5–5.1)
Sodium: 138 mmol/L (ref 135–145)
Total Bilirubin: 0.7 mg/dL (ref 0.3–1.2)
Total Protein: 7 g/dL (ref 6.5–8.1)

## 2020-09-16 LAB — CBC
HCT: 43.9 % (ref 39.0–52.0)
Hemoglobin: 15.3 g/dL (ref 13.0–17.0)
MCH: 31.9 pg (ref 26.0–34.0)
MCHC: 34.9 g/dL (ref 30.0–36.0)
MCV: 91.5 fL (ref 80.0–100.0)
Platelets: 186 10*3/uL (ref 150–400)
RBC: 4.8 MIL/uL (ref 4.22–5.81)
RDW: 13.7 % (ref 11.5–15.5)
WBC: 5.8 10*3/uL (ref 4.0–10.5)
nRBC: 0 % (ref 0.0–0.2)

## 2020-09-16 LAB — LIPASE, BLOOD: Lipase: 41 U/L (ref 11–51)

## 2020-09-16 NOTE — ED Triage Notes (Signed)
Pt presents with c/o abdominal pain. Pt reports he has had the pain for 3 months but it has become worse over the last couple of days. Pt reports some nausea.

## 2020-09-23 ENCOUNTER — Ambulatory Visit: Payer: Medicaid Other | Attending: Physical Medicine and Rehabilitation

## 2020-09-23 ENCOUNTER — Other Ambulatory Visit: Payer: Self-pay

## 2020-09-23 DIAGNOSIS — M25552 Pain in left hip: Secondary | ICD-10-CM | POA: Diagnosis present

## 2020-09-23 DIAGNOSIS — G8929 Other chronic pain: Secondary | ICD-10-CM | POA: Insufficient documentation

## 2020-09-23 DIAGNOSIS — M25562 Pain in left knee: Secondary | ICD-10-CM | POA: Diagnosis not present

## 2020-09-23 DIAGNOSIS — R262 Difficulty in walking, not elsewhere classified: Secondary | ICD-10-CM | POA: Diagnosis present

## 2020-09-23 DIAGNOSIS — M6281 Muscle weakness (generalized): Secondary | ICD-10-CM | POA: Insufficient documentation

## 2020-09-23 NOTE — Therapy (Signed)
[facility] [address] [place], [place], [address] Phone: [phone]   Fax:  [phone]  Physical Therapy Evaluation  Patient Details  Name: Kenneth Roth MRN: [id number] Date of Birth: 06/23/1966 Referring Provider (PT): Romero Belling, MD    Encounter Date: 09/23/2020   PT End of Session - 09/23/20 1527    Visit Number 1    Number of Visits 11    Date for PT Re-Evaluation 12/05/20    Authorization Type [place] [employer] Prepaid [employer] Plan    PT Start Time 1435    PT Stop Time 1520    PT Time Calculation (min) 45 min    Activity Tolerance Patient tolerated treatment well    Behavior During Therapy [facility] for tasks assessed/performed           Past Medical History:  Diagnosis Date  . Closed displaced fracture of right femoral neck (HCC) 01/29/2017  . Diabetes mellitus without complication (HCC)   . Hyperlipidemia   . Intra-abdominal hernia 09/04/2007   Qualifier: Diagnosis of  By: Abby Potash    . Polio     Past Surgical History:  Procedure Laterality Date  . FINGER CLOSED REDUCTION Right 08/08/2017   Procedure: RIGHT LONG FINGER METACARPAL;  Surgeon: Betha Loa, MD;  Location: [facility];  Service: Orthopedics;  Laterality: Right;  . HERNIA REPAIR    . HIP ARTHROPLASTY Right 01/29/2017   Procedure: ARTHROPLASTY BIPOLAR HIP (HEMIARTHROPLASTY);  Surgeon: Sheral Apley, MD;  Location: [facility] OR;  Service: Orthopedics;  Laterality: Right;    There were no vitals filed for this visit.    Subjective Assessment - 09/23/20 1439    Subjective "It started about two years ago and I have pain that is really really bad. I did therapy for same thing last year. I try to avoid walking a lot because I get pain in my knee and sometimes lower and sometimes in my left hip."    Pertinent History diabetes, post-polio muscle weakness    Limitations Sitting;Standing;Walking;Lifting;House hold activities    How long can you  sit comfortably? "I move a lot when sitting and even when sleeping because of the pain."    How long can you stand comfortably? 2-3 minutes, maybe 4 min before "I have to lean on something"    How long can you walk comfortably? 2-3 minutes, maybe 4 min before "I have to lean on something"    Currently in Pain? Yes    Pain Score 5     Pain Location Knee    Pain Orientation Left    Pain Descriptors / Indicators Shooting    Pain Type Chronic pain    Pain Onset More than a month ago    Pain Frequency Constant    Aggravating Factors  sitting, sleeping, walking, standing    Pain Relieving Factors pain medication, injection    Effect of Pain on Daily Activities Has trouble walking, standing, cleaning              [facility] PT Assessment - 09/23/20 0001      Assessment   Medical Diagnosis L buttock and L knee pain/weakness; hx of Polio    Referring Provider (PT) Romero Belling, MD     Onset Date/Surgical Date --   2 years ago   Hand Dominance Right    Next MD Visit "Spine doctor in 1 month"    Prior Therapy Yes 2 years ago for the same condition  Precautions   Precautions None      Restrictions   Weight Bearing Restrictions No      Balance Screen   Has the patient fallen in the past 6 months Yes    How many times? 1   with R ankle injury   Has the patient had a decrease in activity level because of a fear of falling?  Yes    Is the patient reluctant to leave their home because of a fear of falling?  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    Leisure Cooking, hookah bar, play cards, hang out with friends      Cognition   Overall Cognitive Status Within Functional Limits for tasks assessed      Observation/Other Assessments   Focus on Therapeutic Outcomes (FOTO)  No FOTO - Medicaid      Sensation   Light Touch Impaired by gross assessment   "feels different" along LLE     ROM / Strength   AROM / PROM / Strength AROM;PROM;Strength      AROM   AROM Assessment Site Lumbar;Knee;Hip    Right/Left Hip Left    Right/Left Knee Left;Right      Strength   Strength Assessment Site Hip;Knee    Right/Left Hip Right;Left    Right Hip Flexion --   unable to actively FL hip   Left Hip Flexion 3+/5    Right/Left Knee Right;Left    Left Knee Flexion 3+/5    Left Knee Extension 3+/5                      Objective measurements completed on examination: See above findings.       [facility] Adult PT Treatment/Exercise - 09/23/20 0001      Transfers   Five time sit to stand comments  17.28 seconds with weightbearing through LLE. Pt hyperextends L knee in standing and during gait - tactile, verbal, and visual cues to avoid L knee hyperEXT.      Ambulation/Gait   Ambulation/Gait Yes    Ambulation/Gait Assistance 6: Modified independent (Device/Increase time)    Ambulation Distance (Feet) 50 Feet    Assistive device Straight cane    Gait Comments Abnormal gait pattern secondary to symptoms from polio especially in RLE and pain/weakness in LLE. Pt hyperextends LLE during sit<>stand and with each step during ambulation. Leaning and compensation during gait to swing RLE forward. Pt reports "feels a little better" with 2 layer shoe lift in R shoe but continues to have abnormal gait and pain.      Standardized Balance Assessment   Standardized Balance Assessment Timed Up and Go Test      Timed Up and Go Test   TUG Normal TUG    Normal TUG (seconds)  18.84      Self-Care  Self-Care Other Self-Care Comments    Other Self-Care Comments  HEP, potential use of L knee hyperextension prevention brace pending referral - pt advised to visit doctor and given information about biotech/hangar clinics), avoiding L knee hyperextension during gait, strengthening LLE.      Exercises   Exercises Knee/Hip      Knee/Hip Exercises: Seated   Other Seated Knee/Hip Exercises Seated HS stretch 1 x 30 sec LLE      Knee/Hip Exercises: Supine   Straight Leg Raises Strengthening;Left;20 reps    Straight Leg Raises Limitations rest breaks as needed due to fatigue    Other Supine Knee/Hip Exercises Supine piriformis stretch 2 x 30 sec LLE      Manual Therapy   Manual Therapy Soft tissue mobilization;Myofascial release    Myofascial Release L piriformis and glute med                  PT Education - 09/23/20 1430    Education Details HEP, potential use of L knee hyperextension prevention brace pending referral - pt advised to visit doctor and given information about biotech/hangar clinics), avoiding L knee hyperextension during gait, strengthening LLE.    Person(s) Educated Patient    Methods Explanation;Demonstration;Tactile cues;Verbal cues    Comprehension Verbalized understanding;Returned demonstration            PT Short Term Goals - 09/23/20 1752      PT SHORT TERM GOAL #1   Title Pt will be independent with initial HEP    Baseline Initial HEP provided at eval 09/23/2020.    Time 5    Period Weeks    Status New    Target Date 10/28/20      PT SHORT TERM GOAL #2   Title Pt will have a 25% reduction in L knee pain during ambulation on level ground with SPC for improved tolerance during functional activities.    Baseline 5/10 knee pain at rest that increases to 7-8/10 (pt states it is less currently due to recent injection)    Time 5    Period Weeks    Status New    Target Date 10/28/20      PT SHORT TERM GOAL #3   Title Pt will  be able to tolerate standing and walking on level ground without leaning against counter for at least 5 minutes to allow for daily activities.    Baseline 2-3 minutes ("maybe 4 somedays")    Time 5    Period Weeks    Status New    Target Date 10/28/20      PT SHORT TERM GOAL #4   Title Patient will improve 5xSTS time to at least less than 16 seconds.    Baseline 17.28 seconds    Time 5    Period Weeks    Status New    Target Date 10/28/20      PT SHORT TERM GOAL #5   Title Patient will improve TUG time to at least 17.50 seconds or less (using SPC).    Baseline 18.84 seconds    Time 5    Period Weeks    Status New    Target Date 10/28/20             PT Long Term Goals - 09/23/20 1758      PT LONG TERM GOAL #1   Title Patient will be able to walk for 5 minutes on level ground using SPC with less than 4/10 L knee pain.  Baseline 5/10 L knee pain at rest today that quickly increases with activity    Time 10    Period Weeks    Status New    Target Date 12/02/20      PT LONG TERM GOAL #2   Title Pt will be able to tolerate standing and walking on level ground without leaning against counter for at least 10 minutes to allow for daily activities.    Baseline 2-3 minutes ("maybe 4 somedays")    Time 10    Period Weeks    Status New    Target Date 12/02/20      PT LONG TERM GOAL #3   Title Pt will be independent with advanced HEP.    Baseline Initial HEP provided at eval 09/23/2020.    Time 10    Period Weeks    Status New    Target Date 12/02/20      PT LONG TERM GOAL #4   Title Pt will improve 5xSTS time to at least 15.5 seconds (no BUE).    Baseline 17.28 seconds    Time 10    Period Weeks    Status New    Target Date 12/02/20      PT LONG TERM GOAL #5   Title Pt will improve TUG time to 16.5 seconds or less (using SPC).    Baseline 18.84 seconds    Time 10    Period Weeks    Status New    Target Date 12/02/20                  Plan - 09/23/20  1528    Clinical Impression Statement Pt is a 54 year old male that presents to OPPT for L buttock and L knee pain and weakness. Patient has a history of polio and has been unable to actively use or weightbear through his RLE secondary to Polio since he was young, and he occasionally uses SPC and rolling walker for improved balance and activity tolerance. Patient experiences fatigue after standing/walking 2-3 minutes, requiring something to lean against or a seated rest break. Upon gross visual inspection during long sit test, it was determined that patient has a leg length discrepancy with RLE being shorter - 2 layer heel lift was provided for patient as a trial. Pt states his RLE has always been shorter than LLE but feels like it has gotten even shorter since R femoral neck fx in 2018. Pt states he has had heel lifts in the past but has not consistently used them recently. Patient will benefit from skilled PT intervention to maintain function that is not impacted from past poliomyelitis.    Personal Factors and Comorbidities Comorbidity 1;Comorbidity 2;Comorbidity 3+    Comorbidities See PMH above - hx of polio, diabetes    Examination-Activity Limitations Bathing;Bed Mobility;Carry;Bend;Dressing;Lift;Toileting;Stand;Stairs;Locomotion Level;Sit;Sleep;Squat   able to do but fatigue/needs to have support   Examination-Participation Restrictions Cleaning;Community Activity;Occupation    Stability/Clinical Decision Making Evolving/Moderate complexity    Clinical Decision Making Moderate    Rehab Potential Fair    PT Frequency 1x / week    PT Duration Other (comment)   10 weeks   PT Treatment/Interventions ADLs/Self Care Home Management;Aquatic Therapy;Cryotherapy;Electrical Stimulation;Moist Heat;Neuromuscular re-education;Balance training;Therapeutic exercise;Therapeutic activities;Functional mobility training;Stair training;Gait training;Patient/family education;Manual techniques;Passive range of  motion;Energy conservation;Taping;Vasopneumatic Device    PT Next Visit Plan Review HEP and ask about response following eval, inquire about difference with R 2 layer heel lift, incorporate L hip/knee/ankle strengthening interventions to tolerance (  frequent rest breaks due to fatigue)    PT Home Exercise Plan [email or web address] - seated HS stretch, straight leg raise, piriformis stretch    Recommended Other Services asking doctor for referral for L knee hyperextension prevention brace    Consulted and Agree with Plan of Care Patient           Patient will benefit from skilled therapeutic intervention in order to improve the following deficits and impairments:  Abnormal gait, Decreased activity tolerance, Decreased balance, Decreased mobility, Difficulty walking, Impaired tone, Impaired UE functional use, Decreased strength, Impaired sensation, Improper body mechanics, Pain, Decreased endurance, Decreased coordination  Visit Diagnosis: Chronic pain of left knee  Muscle weakness (generalized)  Difficulty in walking, not elsewhere classified  Pain in left hip     Problem List Patient Active Problem List   Diagnosis Date Noted  . Smoker 01/30/2017  . History of alcohol abuse 01/30/2017  . Leukocytosis 01/29/2017  . Weight loss 01/29/2017  . History of poliomyelitis 08/22/2016  . Hyperlipidemia 08/22/2016  . Type 2 diabetes mellitus without complication, without long-term current use of insulin (HCC) 08/22/2016  . 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    Check all possible CPT codes: [id number]- Therapeutic Exercise, [phone]- Neuro Re-education, [phone] - Gait Training, [phone] - Manual Therapy, [phone] - Therapeutic Activities, [id number] - Self Care, [phone] - Electrical stimulation (unattended), [phone] - Iontophoresis, [id number] - Vaso, [id number] - Orthotic Fit and [phone] - Aquatic therapy         Rhea BleacherKajal Chinyere Galiano, PT, DPT 09/23/20 7:55 PM  [facility] [address] [place], [place], [address] Phone: [phone]   Fax:  [phone]  Name: Aggie Hackeraser Birchmeier MRN: [id number] Date of Birth: 06/24/1966

## 2020-10-12 ENCOUNTER — Other Ambulatory Visit: Payer: Self-pay

## 2020-10-12 ENCOUNTER — Encounter: Payer: Self-pay | Admitting: Physical Therapy

## 2020-10-12 ENCOUNTER — Ambulatory Visit: Payer: Medicaid Other | Attending: Physical Medicine and Rehabilitation | Admitting: Physical Therapy

## 2020-10-12 DIAGNOSIS — R262 Difficulty in walking, not elsewhere classified: Secondary | ICD-10-CM | POA: Insufficient documentation

## 2020-10-12 DIAGNOSIS — G8929 Other chronic pain: Secondary | ICD-10-CM | POA: Insufficient documentation

## 2020-10-12 DIAGNOSIS — M6281 Muscle weakness (generalized): Secondary | ICD-10-CM | POA: Insufficient documentation

## 2020-10-12 DIAGNOSIS — M25562 Pain in left knee: Secondary | ICD-10-CM | POA: Insufficient documentation

## 2020-10-12 NOTE — Therapy (Addendum)
[place], [place], [address] Phone: [phone]   Fax:  [phone]  Physical Therapy Treatment/Discharge Summary  Patient Details  Name: Kenneth Roth MRN: [id number] Date of Birth: 12/20/1965 Referring Provider (PT): Laroy Apple, MD    Encounter Date: 10/12/2020   PT End of Session - 10/12/20 1802    Visit Number 2    Number of Visits 11    Date for PT Re-Evaluation 12/05/20    Authorization Type [place] Medicaid [place]    PT Start Time 1500    PT Stop Time 1542    PT Time Calculation (min) 42 min    Activity Tolerance Patient tolerated treatment well    Behavior During Therapy [facility] for tasks assessed/performed           Past Medical History:  Diagnosis Date  . Closed displaced fracture of right femoral neck ([place]) 01/29/2017  . Diabetes mellitus without complication ([place])   . Hyperlipidemia   . Intra-abdominal hernia 09/04/2007   Qualifier: Diagnosis of  By: Enrique Sack    . Polio     Past Surgical History:  Procedure Laterality Date  . FINGER CLOSED REDUCTION Right 08/08/2017   Procedure: RIGHT LONG FINGER METACARPAL;  Surgeon: Leanora Cover, MD;  Location: [place];  Service: Orthopedics;  Laterality: Right;  . HERNIA REPAIR    . HIP ARTHROPLASTY Right 01/29/2017   Procedure: ARTHROPLASTY BIPOLAR HIP (HEMIARTHROPLASTY);  Surgeon: Renette Butters, MD;  Location: [facility];  Service: Orthopedics;  Laterality: Right;    There were no vitals filed for this visit.   Subjective Assessment - 10/12/20 1501    Subjective my knee is hurting, same since last time I came in. Heel lift felt like it threw me off balance so I took it out. I forgot to call about the left knee brace but I will do it todya.    Pertinent History diabetes, post-polio muscle weakness    Limitations Sitting;Standing;Walking;Lifting;House hold activities    How long can you sit comfortably? "I move a lot  when sitting and even when sleeping because of the pain."    How long can you stand comfortably? 2-3 minutes, maybe 4 min before "I have to lean on something"    How long can you walk comfortably? 2-3 minutes, maybe 4 min before "I have to lean on something"    Currently in Pain? Yes    Pain Score 5     Pain Location Knee    Pain Orientation Left    Pain Descriptors / Indicators Shooting    Pain Type Chronic pain    Pain Onset More than a month ago    Aggravating Factors  walking is the worst, standing    Pain Relieving Factors sitting,    Effect of Pain on Daily Activities Able to work but leans on cane or counter                             [facility] Adult PT Treatment/Exercise - 10/12/20 0001      Ambulation/Gait   Ambulation/Gait Yes    Ambulation/Gait Assistance 6: Modified independent (Device/Increase time)    Ambulation Distance (Feet) 50 Feet    Assistive device Straight cane    Gait Pattern Left foot flat;Right foot flat;Antalgic   Increased stride length on L; decreased on R   Ambulation Surface Level;Indoor    Gait Comments .Marland KitchenMarland Kitchen  Self-Care   Other Self-Care Comments  HEP, use of shoe lift to help with gait       Knee/Hip Exercises: Stretches   Active Hamstring Stretch Left;1 rep;30 seconds      Knee/Hip Exercises: Seated   Long Arc Quad AROM;Left;2 sets;10 reps    Long Arc Quad Limitations eccentric lowering trying for 3s lower    Other Seated Knee/Hip Exercises --    Sit to Sand 2 sets;20 reps;with UE support;Other (comment)   Cane in RUE, folded slantboard 1.5" RLE     Knee/Hip Exercises: Supine   Straight Leg Raises Strengthening;Left;20 reps    Straight Leg Raises Limitations rest breaks as needed due to fatigue    Other Supine Knee/Hip Exercises Supine piriformis stretch 2 x 30 sec LLE      Manual Therapy   Manual Therapy Soft tissue mobilization;Myofascial release;Other (comment)   Patellar mobilizations all 4 directions grade I & II    Manual therapy comments STM with roller to left quadriceps    Soft tissue mobilization IASTM to quadriceps and patellar tendon with cross-friction and j-stroke                  PT Education - 10/12/20 1801    Education Details Anatomy of the knee related to hyperextension and increased use of LLE due to deficits of RLE; benefit of shoe lift on the right    Person(s) Educated Patient    Methods Other (comment);Explanation   knee model   Comprehension Verbalized understanding            PT Short Term Goals - 09/23/20 1752      PT SHORT TERM GOAL #1   Title Pt will be independent with initial HEP    Baseline Initial HEP provided at eval 09/23/2020.    Time 5    Period Weeks    Status New    Target Date 10/28/20      PT SHORT TERM GOAL #2   Title Pt will have a 25% reduction in L knee pain during ambulation on level ground with SPC for improved tolerance during functional activities.    Baseline 5/10 knee pain at rest that increases to 7-8/10 (pt states it is less currently due to recent injection)    Time 5    Period Weeks    Status New    Target Date 10/28/20      PT SHORT TERM GOAL #3   Title Pt will be able to tolerate standing and walking on level ground without leaning against counter for at least 5 minutes to allow for daily activities.    Baseline 2-3 minutes ("maybe 4 somedays")    Time 5    Period Weeks    Status New    Target Date 10/28/20      PT SHORT TERM GOAL #4   Title Patient will improve 5xSTS time to at least less than 16 seconds.    Baseline 17.28 seconds    Time 5    Period Weeks    Status New    Target Date 10/28/20      PT SHORT TERM GOAL #5   Title Patient will improve TUG time to at least 17.50 seconds or less (using SPC).    Baseline 18.84 seconds    Time 5    Period Weeks    Status New    Target Date 10/28/20             PT Long Term  Goals - 09/23/20 1758      PT LONG TERM GOAL #1   Title Patient will be able to walk for  5 minutes on level ground using SPC with less than 4/10 L knee pain.    Baseline 5/10 L knee pain at rest today that quickly increases with activity    Time 10    Period Weeks    Status New    Target Date 12/02/20      PT LONG TERM GOAL #2   Title Pt will be able to tolerate standing and walking on level ground without leaning against counter for at least 10 minutes to allow for daily activities.    Baseline 2-3 minutes ("maybe 4 somedays")    Time 10    Period Weeks    Status New    Target Date 12/02/20      PT LONG TERM GOAL #3   Title Pt will be independent with advanced HEP.    Baseline Initial HEP provided at eval 09/23/2020.    Time 10    Period Weeks    Status New    Target Date 12/02/20      PT LONG TERM GOAL #4   Title Pt will improve 5xSTS time to at least 15.5 seconds (no BUE).    Baseline 17.28 seconds    Time 10    Period Weeks    Status New    Target Date 12/02/20      PT LONG TERM GOAL #5   Title Pt will improve TUG time to 16.5 seconds or less (using SPC).    Baseline 18.84 seconds    Time 10    Period Weeks    Status New    Target Date 12/02/20                 Plan - 10/12/20 1541    Clinical Impression Statement Pt reports completing HEP intermittently; upon review Pt was able to complete all exercises with minimal cuing; HEP was progressed to include STS with 1.5" step (slant board) under right foot; long arc quads w/eccentric focus that were well tolerated with minimal cuing. Pt would likely benefit from a shoe lift on the right to help with gait and reduce stress on the left LE, especially the knee, as the patient takes an extended stride on the left.    Personal Factors and Comorbidities Comorbidity 1;Comorbidity 2;Comorbidity 3+    Comorbidities See PMH above - hx of polio, diabetes    Examination-Activity Limitations Bathing;Bed Mobility;Carry;Bend;Dressing;Lift;Toileting;Stand;Stairs;Locomotion Level;Sit;Sleep;Squat   able to do but  fatigue/needs to have support   Examination-Participation Restrictions Cleaning;Community Activity;Occupation    Stability/Clinical Decision Making Evolving/Moderate complexity    Rehab Potential Fair    PT Frequency 1x / week    PT Duration Other (comment)   10 weeks   PT Treatment/Interventions ADLs/Self Care Home Management;Aquatic Therapy;Cryotherapy;Electrical Stimulation;Moist Heat;Neuromuscular re-education;Balance training;Therapeutic exercise;Therapeutic activities;Functional mobility training;Stair training;Gait training;Patient/family education;Manual techniques;Passive range of motion;Energy conservation;Taping;Vasopneumatic Device    PT Next Visit Plan Inquire about right shoe lift; incorporate L hip/knee/ankle strengthening interventions to tolerance (frequent rest breaks due to fatigue), gait training, test left hip extensors    PT Home Exercise Plan [id number] - seated HS stretch, straight leg raise, piriformis stretch, STS with right foot on 1.5" step, long arc quads with eccentric focus    Consulted and Agree with Plan of Care Patient           Patient will benefit from skilled therapeutic intervention  in order to improve the following deficits and impairments:  Abnormal gait, Decreased activity tolerance, Decreased balance, Decreased mobility, Difficulty walking, Impaired tone, Impaired UE functional use, Decreased strength, Impaired sensation, Improper body mechanics, Pain, Decreased endurance, Decreased coordination  Visit Diagnosis: Chronic pain of left knee  Muscle weakness (generalized)  Difficulty in walking, not elsewhere classified     Problem List Patient Active Problem List   Diagnosis Date Noted  . Smoker 01/30/2017  . History of alcohol abuse 01/30/2017  . Leukocytosis 01/29/2017  . Weight loss 01/29/2017  . History of poliomyelitis 08/22/2016  . Hyperlipidemia 08/22/2016  . Type 2 diabetes mellitus without complication, without long-term current use  of insulin ([place]) 08/22/2016  . 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  Patient goals were not met. Patient is being discharged due to a change in medical status.  ?????      Haydee Monica, PT, DPT 11/05/20 7:43 PM      Dawayne Cirri, SPT 10/12/2020, 6:14 PM  [place], [place], [address] Phone: [phone]   Fax:  [phone]  Name: Kenneth Roth MRN: [id number] Date of Birth: 07-08-1966

## 2020-10-12 NOTE — Patient Instructions (Signed)
Access Code: [email or web address] URL: [email or web address] Date: 10/12/2020 Prepared by: [facility].  Exercises Supine Active Straight Leg Raise - 1 x daily - 7 x weekly - 3 sets - 10 reps Seated Hamstring Stretch - 1 x daily - 7 x weekly - 3 sets - 10 reps Supine Piriformis Stretch with Foot on Ground - 1 x daily - 7 x weekly - 3 sets - 10 reps Sit to Stand - 1 x daily - 7 x weekly - 3 sets - 12 reps Seated Long Arc Quad - 1 x daily - 7 x weekly - 3 sets - 10 reps - 3 hold

## 2020-10-19 ENCOUNTER — Ambulatory Visit: Payer: Medicaid Other | Admitting: Physical Therapy

## 2020-10-19 ENCOUNTER — Telehealth: Payer: Self-pay | Admitting: Physical Therapy

## 2020-10-19 NOTE — Telephone Encounter (Signed)
Left VM for pt expressing concern due to his no show today; reminded pt of no show policy and of his upcoming appointment next week.  Johnn Hai, SPT 10/19/20 17:37

## 2020-10-26 ENCOUNTER — Ambulatory Visit: Payer: Medicaid Other | Admitting: Physical Therapy

## 2020-10-26 ENCOUNTER — Telehealth: Payer: Self-pay | Admitting: Physical Therapy

## 2020-10-26 NOTE — Telephone Encounter (Signed)
LVM regarding today's missed appointment, and that today is his second missed appointment. Discussed that after 2 missed appointments we have to cancel all future appointments except for his next scheduled visit. If he misses that appointment it will be 3 in a row which is grounds for discharge. I noted the next day/ time of his appointment and if he cannot make it to call and we can work to cancel or reschedule that to him.   Renette Hsu PT, DPT, LAT, ATC  10/26/20  3:54 PM

## 2020-11-02 ENCOUNTER — Ambulatory Visit: Payer: Medicaid Other | Attending: Physical Medicine and Rehabilitation

## 2020-11-03 ENCOUNTER — Telehealth: Payer: Self-pay

## 2020-11-03 NOTE — Telephone Encounter (Signed)
PT called and left voicemail informing patient that he will be D/C per attendance policy since yesterday was his 3rd consecutive no show. PT stated that pt can contact his doctor for a new referral for skilled PT if he wishes to return to physical therapy.  Rhea Bleacher, PT, DPT 11/03/20 5:15 PM

## 2020-11-09 ENCOUNTER — Encounter: Payer: Medicaid Other | Admitting: Physical Therapy

## 2020-11-16 ENCOUNTER — Encounter: Payer: Medicaid Other | Admitting: Physical Therapy

## 2020-11-23 ENCOUNTER — Encounter: Payer: Medicaid Other | Admitting: Physical Therapy

## 2020-11-23 ENCOUNTER — Other Ambulatory Visit: Payer: Self-pay

## 2020-11-23 ENCOUNTER — Emergency Department (HOSPITAL_COMMUNITY)
Admission: EM | Admit: 2020-11-23 | Discharge: 2020-11-24 | Disposition: A | Discharge status: Home / Self Care | Payer: Medicaid Other | Attending: Emergency Medicine | Admitting: Emergency Medicine

## 2020-11-23 DIAGNOSIS — R1084 Generalized abdominal pain: Secondary | ICD-10-CM | POA: Insufficient documentation

## 2020-11-23 DIAGNOSIS — E119 Type 2 diabetes mellitus without complications: Secondary | ICD-10-CM | POA: Diagnosis not present

## 2020-11-23 DIAGNOSIS — K5900 Constipation, unspecified: Secondary | ICD-10-CM | POA: Diagnosis not present

## 2020-11-23 DIAGNOSIS — Z7984 Long term (current) use of oral hypoglycemic drugs: Secondary | ICD-10-CM | POA: Diagnosis not present

## 2020-11-23 DIAGNOSIS — F17210 Nicotine dependence, cigarettes, uncomplicated: Secondary | ICD-10-CM | POA: Diagnosis not present

## 2020-11-23 DIAGNOSIS — K219 Gastro-esophageal reflux disease without esophagitis: Secondary | ICD-10-CM

## 2020-11-23 DIAGNOSIS — Z96641 Presence of right artificial hip joint: Secondary | ICD-10-CM | POA: Insufficient documentation

## 2020-11-23 DIAGNOSIS — R109 Unspecified abdominal pain: Secondary | ICD-10-CM | POA: Diagnosis present

## 2020-11-23 DIAGNOSIS — G8929 Other chronic pain: Secondary | ICD-10-CM | POA: Diagnosis not present

## 2020-11-23 DIAGNOSIS — Z7982 Long term (current) use of aspirin: Secondary | ICD-10-CM | POA: Insufficient documentation

## 2020-11-23 LAB — URINALYSIS, ROUTINE W REFLEX MICROSCOPIC
Bilirubin Urine: NEGATIVE
Glucose, UA: 150 mg/dL — AB
Hgb urine dipstick: NEGATIVE
Ketones, ur: NEGATIVE mg/dL
Leukocytes,Ua: NEGATIVE
Nitrite: NEGATIVE
Protein, ur: NEGATIVE mg/dL
Specific Gravity, Urine: 1.006 (ref 1.005–1.030)
pH: 6 (ref 5.0–8.0)

## 2020-11-23 LAB — CBC
HCT: 39.1 % (ref 39.0–52.0)
Hemoglobin: 14.1 g/dL (ref 13.0–17.0)
MCH: 33.1 pg (ref 26.0–34.0)
MCHC: 36.1 g/dL — ABNORMAL HIGH (ref 30.0–36.0)
MCV: 91.8 fL (ref 80.0–100.0)
Platelets: 152 10*3/uL (ref 150–400)
RBC: 4.26 MIL/uL (ref 4.22–5.81)
RDW: 12.7 % (ref 11.5–15.5)
WBC: 6.8 10*3/uL (ref 4.0–10.5)
nRBC: 0 % (ref 0.0–0.2)

## 2020-11-23 LAB — COMPREHENSIVE METABOLIC PANEL
ALT: 45 U/L — ABNORMAL HIGH (ref 0–44)
AST: 43 U/L — ABNORMAL HIGH (ref 15–41)
Albumin: 3.5 g/dL (ref 3.5–5.0)
Alkaline Phosphatase: 87 U/L (ref 38–126)
Anion gap: 12 (ref 5–15)
BUN: 6 mg/dL (ref 6–20)
CO2: 24 mmol/L (ref 22–32)
Calcium: 8.8 mg/dL — ABNORMAL LOW (ref 8.9–10.3)
Chloride: 100 mmol/L (ref 98–111)
Creatinine, Ser: 0.82 mg/dL (ref 0.61–1.24)
GFR, Estimated: >60 mL/min (ref >60)
Glucose, Bld: 214 mg/dL — ABNORMAL HIGH (ref 70–99)
Potassium: 3 mmol/L — ABNORMAL LOW (ref 3.5–5.1)
Sodium: 136 mmol/L (ref 135–145)
Total Bilirubin: 0.6 mg/dL (ref 0.3–1.2)
Total Protein: 5.6 g/dL — ABNORMAL LOW (ref 6.5–8.1)

## 2020-11-23 LAB — LIPASE, BLOOD: Lipase: 52 U/L — ABNORMAL HIGH (ref 11–51)

## 2020-11-23 NOTE — ED Triage Notes (Signed)
Pt presents to ED POV. Pt c/o generalized abd pain x4months. Pt states that he has appointment w/ GI next month but cannot take pain. Pt reports nausea, denies v/d

## 2020-11-24 MED ORDER — SUCRALFATE 1 G PO TABS: 1.0000 g | ORAL_TABLET | Freq: Three times a day (TID) | ORAL | 1 refills | Status: AC

## 2020-11-24 MED ORDER — FAMOTIDINE 20 MG PO TABS: 20.0000 mg | ORAL_TABLET | Freq: Two times a day (BID) | ORAL | 0 refills | Status: AC

## 2020-11-24 MED ORDER — PANTOPRAZOLE SODIUM 40 MG PO TBEC
40.0000 mg | DELAYED_RELEASE_TABLET | Freq: Once | ORAL | Status: AC
  Administered 2020-11-24: 08:00:00 40 mg via ORAL
  Filled 2020-11-24: qty 1

## 2020-11-24 MED ORDER — ALUM & MAG HYDROXIDE-SIMETH 200-200-20 MG/5ML PO SUSP
30.0000 mL | Freq: Once | ORAL | Status: AC
  Administered 2020-11-24: 08:00:00 30 mL via ORAL
  Filled 2020-11-24: qty 30

## 2020-11-24 MED ORDER — LIDOCAINE VISCOUS HCL 2 % MT SOLN
15.0000 mL | Freq: Once | OROMUCOSAL | Status: AC
  Administered 2020-11-24: 08:00:00 15 mL via ORAL
  Filled 2020-11-24: qty 15

## 2020-11-24 MED ORDER — PANTOPRAZOLE SODIUM 20 MG PO TBEC: 20.0000 mg | DELAYED_RELEASE_TABLET | Freq: Every day | ORAL | 0 refills | Status: AC

## 2020-11-24 MED ORDER — POTASSIUM CHLORIDE CRYS ER 20 MEQ PO TBCR
40.0000 meq | EXTENDED_RELEASE_TABLET | Freq: Once | ORAL | Status: AC
  Administered 2020-11-24: 08:00:00 40 meq via ORAL
  Filled 2020-11-24: qty 2

## 2020-11-24 NOTE — Discharge Instructions (Signed)
Please read and follow all provided instructions.  Your diagnoses today include:  1. Generalized abdominal pain   2. Gastroesophageal reflux disease, unspecified whether esophagitis present     Tests performed today include:  Blood cell counts and platelets - low potassium  Kidney and liver function tests - slightly high liver tests due to alcohol use  Pancreas function test (called lipase)  Urine test to look for infection  A blood or urine test for pregnancy (women only)  Vital signs. See below for your results today.   Medications prescribed:   Pantoprazole - stomach acid reducer   Pepcid (famotidine) - antihistamine  You can find this medication over-the-counter.   DO NOT exceed:   20mg  Pepcid every 12 hours   Omeprazole (Prilosec) - stomach acid reducer  This medication can be found over-the-counter  Take any prescribed medications only as directed.  Home care instructions:   Follow any educational materials contained in this packet.  Follow-up instructions: Please follow-up with your GI doctor as planned for further evaluation of your symptoms.    Return instructions:  SEEK IMMEDIATE MEDICAL ATTENTION IF:  The pain does not go away or becomes severe   A temperature above 101F develops   Repeated vomiting occurs (multiple episodes)   The pain becomes localized to portions of the abdomen. The right side could possibly be appendicitis. In an adult, the left lower portion of the abdomen could be colitis or diverticulitis.   Blood is being passed in stools or vomit (bright red or black tarry stools)   You develop chest pain, difficulty breathing, dizziness or fainting, or become confused, poorly responsive, or inconsolable (young children)  If you have any other emergent concerns regarding your health  Additional Information: Abdominal (belly) pain can be caused by many things. Your caregiver performed an examination and possibly ordered blood/urine  tests and imaging (CT scan, x-rays, ultrasound). Many cases can be observed and treated at home after initial evaluation in the emergency department. Even though you are being discharged home, abdominal pain can be unpredictable. Therefore, you need a repeated exam if your pain does not resolve, returns, or worsens. Most patients with abdominal pain don't have to be admitted to the hospital or have surgery, but serious problems like appendicitis and gallbladder attacks can start out as nonspecific pain. Many abdominal conditions cannot be diagnosed in one visit, so follow-up evaluations are very important.  Your vital signs today were: BP (!) 133/99 (BP Location: Right Arm)   Pulse 74   Temp 98.3 F (36.8 C) (Oral)   Resp 16   SpO2 97%  If your blood pressure (bp) was elevated above 135/85 this visit, please have this repeated by your doctor within one month. --------------

## 2020-11-24 NOTE — ED Provider Notes (Signed)
[facility] EMERGENCY DEPARTMENT Provider Note   CSN: [id number] Arrival date & time: 11/23/20  1836     History Chief Complaint  Patient presents with  . Abdominal Pain    Kenneth Roth is a 54 y.o. male.  Patient history of diabetes, hyperlipidemia, abdominal hernia, no previous abdominal surgeries, chronic alcohol use --presents to the emergency department for evaluation of of abdominal pain.  Patient states that he has had generalized abdominal pain which started about 7 months ago.  This was initially intermittent.  Over the past 2 months it has been more chronic.  He states that he has liquid that comes up from his stomach into his mouth that is made his tongue yellow.  Pain is described as burning in nature.  It does not radiate.  It is generalized.  He has nausea "all the time".  Occasional episodes of vomiting reported.  He reports constipation, no diarrhea.  No urinary symptoms.  He states that he has been taking over-the-counter medication and a medication prescribed by his primary care doctor that he cannot remember, without improvement.  He has an upcoming appointment with gastroenterology but wanted to be seen sooner due to severity of symptoms.        Past Medical History:  Diagnosis Date  . Closed displaced fracture of right femoral neck (HCC) 01/29/2017  . Diabetes mellitus without complication (HCC)   . Hyperlipidemia   . Intra-abdominal hernia 09/04/2007   Qualifier: Diagnosis of  By: Abby Potash    . Polio     Patient Active Problem List   Diagnosis Date Noted  . Smoker 01/30/2017  . History of alcohol abuse 01/30/2017  . Leukocytosis 01/29/2017  . Weight loss 01/29/2017  . History of poliomyelitis 08/22/2016  . Hyperlipidemia 08/22/2016  . Type 2 diabetes mellitus without complication, without long-term current use of insulin (HCC) 08/22/2016  . 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 FINGER CLOSED REDUCTION Right 08/08/2017   Procedure: RIGHT LONG FINGER METACARPAL;  Surgeon: Betha Loa, MD;  Location: [facility];  Service: Orthopedics;  Laterality: Right;  . HERNIA REPAIR    . HIP ARTHROPLASTY Right 01/29/2017   Procedure: ARTHROPLASTY BIPOLAR HIP (HEMIARTHROPLASTY);  Surgeon: Sheral Apley, MD;  Location: [facility] OR;  Service: Orthopedics;  Laterality: Right;       Family History  Problem Relation Age of Onset  . Diabetes Mother   . Diabetes Sister   . Diabetes Brother   . Diabetes Sister   . Colon cancer Neg Hx     Social History   Tobacco Use  . Smoking status: Current Every Day Smoker    Packs/day: 1.50    Types: Cigarettes  . Smokeless tobacco: Current User  Vaping Use  . Vaping Use: Never used  Substance Use Topics  . Alcohol use: Yes    Alcohol/week: 24.0 standard drinks    Types: 24 Shots of liquor per week    Comment: 4 shots a day per pt. not on Sundays  . Drug use: No    Home Medications Prior to Admission medications   Medication Sig Start Date End Date Taking? Authorizing Provider  aspirin 325 MG EC tablet Take by mouth. 01/29/17  Yes [provider]  atorvastatin (LIPITOR) 40 MG tablet Take 1 tablet (40 mg total) by mouth daily. 04/06/17  Yes Jeffery, Chelle, PA  metFORMIN (GLUCOPHAGE-XR) 500 MG 24 hr tablet Take 2 tablets (  1,000 mg total) by mouth 2 (two) times daily. Patient taking differently: Take 500 mg by mouth 2 (two) times daily. 04/06/17  Yes Jeffery, Chelle, PA  BELBUCA 600 MCG FILM Take 1 strip by mouth 2 (two) times daily. 07/27/20   [provider]  Cholecalciferol (VITAMIN D) 50 MCG (2000 UT) tablet 1 tab(s)    [provider]  pantoprazole (PROTONIX) 40 MG tablet Take 40 mg by mouth daily. 10/23/20   [provider]  SUBOXONE 8-2 MG FILM SMARTSIG:1 Strip(s) Sublingual Every 12 Hours 10/06/20   [provider]    Allergies    Patient has no known  allergies.  Review of Systems   Review of Systems  Constitutional: Negative for fever.  HENT: Negative for rhinorrhea and sore throat.   Eyes: Negative for redness.  Respiratory: Negative for cough and shortness of breath.   Cardiovascular: Negative for chest pain.  Gastrointestinal: Positive for abdominal pain and nausea. Negative for diarrhea and vomiting.  Genitourinary: Negative for dysuria and hematuria.  Musculoskeletal: Negative for myalgias.  Skin: Negative for rash.  Neurological: Negative for headaches.    Physical Exam Updated Vital Signs BP (!) 147/77   Pulse 89   Temp 98.1 F (36.7 C) (Oral)   Resp 16   SpO2 98%   Physical Exam Vitals and nursing note reviewed.  Constitutional:      Appearance: He is well-developed and well-nourished.  HENT:     Head: Normocephalic and atraumatic.  Eyes:     General:        Right eye: No discharge.        Left eye: No discharge.     Conjunctiva/sclera: Conjunctivae normal.  Cardiovascular:     Rate and Rhythm: Normal rate and regular rhythm.     Heart sounds: Normal heart sounds.  Pulmonary:     Effort: Pulmonary effort is normal.     Breath sounds: Normal breath sounds.  Abdominal:     Palpations: Abdomen is soft.     Tenderness: There is generalized abdominal tenderness (Patient reports generalized abdominal tenderness, but makes no expression to light or deep palpation of the abdomen). There is no guarding or rebound. Negative signs include Murphy's sign and McBurney's sign.  Musculoskeletal:     Cervical back: Normal range of motion and neck supple.  Skin:    General: Skin is warm and dry.  Neurological:     Mental Status: He is alert.  Psychiatric:        Mood and Affect: Mood and affect normal.     ED Results / Procedures / Treatments   Labs (all labs ordered are listed, but only abnormal results are displayed) Labs Reviewed  LIPASE, BLOOD - Abnormal; Notable for the following components:      Result  Value   Lipase 52 (*)    All other components within normal limits  COMPREHENSIVE METABOLIC PANEL - Abnormal; Notable for the following components:   Potassium 3.0 (*)    Glucose, Bld 214 (*)    Calcium 8.8 (*)    Total Protein 5.6 (*)    AST 43 (*)    ALT 45 (*)    All other components within normal limits  CBC - Abnormal; Notable for the following components:   MCHC 36.1 (*)    All other components within normal limits  URINALYSIS, ROUTINE W REFLEX MICROSCOPIC - Abnormal; Notable for the following components:   Glucose, UA 150 (*)    All other components  within normal limits    EKG None  Radiology No results found.  Procedures Procedures (including critical care time)  Medications Ordered in ED Medications  pantoprazole (PROTONIX) EC tablet 40 mg (40 mg Oral Given 11/24/20 0757)  alum & mag hydroxide-simeth (MAALOX/MYLANTA) 200-200-20 MG/5ML suspension 30 mL (30 mLs Oral Given 11/24/20 0757)    And  lidocaine (XYLOCAINE) 2 % viscous mouth solution 15 mL (15 mLs Oral Given 11/24/20 0757)  potassium chloride SA (KLOR-CON) CR tablet 40 mEq (40 mEq Oral Given 11/24/20 0756)    ED Course  I have reviewed the triage vital signs and the nursing notes.  Pertinent labs & imaging results that were available during my care of the patient were reviewed by me and considered in my medical decision making (see chart for details).  Patient seen and examined. Work-up reviewed.  Labs show mild transaminitis consistent with ongoing alcohol use as well as mildly elevated lipase.  Also low potassium noted.  Patient denies chest symptoms.  Symptoms are very consistent with reflux.  He likely has an element of reflux esophagitis or alcoholic gastritis.  Will give Protonix and GI cocktail here.  Patient will likely be discharged home on PPI, H2 blocker, Carafate.  Strongly encourage PCP follow-up.  I do not feel that he requires CT imaging at this time as his abdominal exam is essentially benign,  no white blood cell count.  Vital signs reviewed and are as follows: BP (!) 147/77   Pulse 89   Temp 98.1 F (36.7 C) (Oral)   Resp 16   SpO2 98%   8:21 AM Notified by RN that he is ready to go.   Pt seen. He is sitting on edge of bed, appears comfortable.   Home with carafate, protonix, pepcid. Encouraged to keep GI appointment.   The patient was urged to return to the Emergency Department immediately with worsening of current symptoms, worsening abdominal pain, persistent vomiting, blood noted in stools, fever, or any other concerns. The patient verbalized understanding.     MDM Rules/Calculators/A&P                          Abdominal pain: Patient with abdominal pain, generalized. Vitals are stable, no fever. Labs mild hypokalemia, mild elevated liver/pancreas. Imaging not felt indicated. No signs of dehydration, patient is tolerating PO's. Lungs are clear and no signs suggestive of PNA. Low concern for appendicitis, cholecystitis, pancreatitis, ruptured viscus, UTI, kidney stone, aortic dissection, aortic aneurysm or other emergent abdominal etiology. Supportive therapy indicated with return if symptoms worsen.   Low  Potassium: replaced here, likely related to poor PO intake.     Final Clinical Impression(s) / ED Diagnoses Final diagnoses:  Generalized abdominal pain  Gastroesophageal reflux disease, unspecified whether esophagitis present    Rx / DC Orders ED Discharge Orders         Ordered    pantoprazole (PROTONIX) 20 MG tablet  Daily        11/24/20 0816    famotidine (PEPCID) 20 MG tablet  2 times daily        11/24/20 0816    sucralfate (CARAFATE) 1 g tablet  3 times daily with meals & bedtime        11/24/20 0816           Renne Crigler, PA-C 11/24/20 2694    Wilkie Aye, Mayer Masker, MD 11/24/20 [phone]
